# Patient Record
Sex: Female | Born: 1995 | Race: Black or African American | Hispanic: No | Marital: Single | Smoking: Never smoker
Health system: Southern US, Community
[De-identification: ages and names within clinical notes are randomized; demographics above are authoritative.]

## PROBLEM LIST (undated history)

## (undated) DIAGNOSIS — F419 Anxiety disorder, unspecified: Secondary | ICD-10-CM

## (undated) DIAGNOSIS — F329 Major depressive disorder, single episode, unspecified: Secondary | ICD-10-CM

## (undated) DIAGNOSIS — J302 Other seasonal allergic rhinitis: Secondary | ICD-10-CM

## (undated) DIAGNOSIS — F53 Postpartum depression: Secondary | ICD-10-CM

## (undated) DIAGNOSIS — J45909 Unspecified asthma, uncomplicated: Secondary | ICD-10-CM

## (undated) HISTORY — DX: Postpartum depression: F53.0

## (undated) HISTORY — DX: Major depressive disorder, single episode, unspecified: F32.9

## (undated) HISTORY — DX: Anxiety disorder, unspecified: F41.9

## (undated) HISTORY — PX: WISDOM TOOTH EXTRACTION: SHX21

## (undated) HISTORY — PX: ANKLE SURGERY: SHX546

---
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
## 2015-07-28 ENCOUNTER — Encounter (HOSPITAL_COMMUNITY): Payer: Self-pay | Admitting: Emergency Medicine

## 2015-07-28 ENCOUNTER — Emergency Department (HOSPITAL_COMMUNITY): Payer: Medicaid Other

## 2015-07-28 ENCOUNTER — Emergency Department (HOSPITAL_COMMUNITY)
Admission: EM | Admit: 2015-07-28 | Discharge: 2015-07-28 | Disposition: A | Discharge status: Home / Self Care | Payer: Medicaid Other | Attending: Emergency Medicine | Admitting: Emergency Medicine

## 2015-07-28 DIAGNOSIS — J45909 Unspecified asthma, uncomplicated: Secondary | ICD-10-CM | POA: Insufficient documentation

## 2015-07-28 DIAGNOSIS — M25571 Pain in right ankle and joints of right foot: Secondary | ICD-10-CM | POA: Insufficient documentation

## 2015-07-28 MED ORDER — NAPROXEN 500 MG PO TABS: 500.0000 mg | ORAL_TABLET | Freq: Two times a day (BID) | ORAL | Status: DC

## 2015-07-28 MED ORDER — DIAZEPAM 5 MG PO TABS: 5.0000 mg | ORAL_TABLET | Freq: Two times a day (BID) | ORAL | Status: DC

## 2015-07-28 MED ORDER — DIAZEPAM 5 MG PO TABS
5.0000 mg | ORAL_TABLET | Freq: Once | ORAL | Status: AC
  Administered 2015-07-28: 5 mg via ORAL
  Filled 2015-07-28: qty 1

## 2015-07-28 MED ORDER — NAPROXEN 250 MG PO TABS
500.0000 mg | ORAL_TABLET | Freq: Once | ORAL | Status: AC
  Administered 2015-07-28: 500 mg via ORAL
  Filled 2015-07-28: qty 2

## 2015-07-28 NOTE — ED Notes (Signed)
Pt sts increased right ankle pain after starting job that involves increased walking; pt sts hx of sx on same ankle

## 2015-07-28 NOTE — ED Provider Notes (Signed)
History  By signing my name below, I, Earmon PhoenixJennifer Waddell, attest that this documentation has been prepared under the direction and in the presence of S. Lane HackerNicole Jenin Birdsall, PA-C. Electronically Signed: Earmon PhoenixJennifer Waddell, ED Scribe. 07/28/2015. 12:12 PM.  Chief Complaint  Patient presents with  . Ankle Pain   The history is provided by the patient and medical records. No language interpreter was used.    HPI Comments:  Alison Mcbride is a 20 y.o. female who presents to the Emergency Department complaining of right ankle pain that began worsening about two weeks ago. She states she normally has pain since started her job but felt a pulling sensation while at work a couple weeks ago. She has been soaking the right foot in hot water and epsom salt and has taken Tylenol with no significant relief of the pain. Bearing weight and walking increases the pain. She denies alleviating factors. She denies fever, chills, nausea, vomiting, bruising, wounds, tingling or weakness of the right foot or ankle. She reports having surgery to the right ankle in the past.    Past Medical History  Diagnosis Date  . Asthma   . Seasonal allergies    Past Surgical History  Procedure Laterality Date  . Ankle surgery    . Wisdom tooth extraction     History reviewed. No pertinent family history. Social History  Substance Use Topics  . Smoking status: Never Smoker   . Smokeless tobacco: Never Used  . Alcohol Use: No   OB History    Gravida Para Term Preterm AB TAB SAB Ectopic Multiple Living   1 1 1       0 1     Review of Systems A complete 10 system review of systems was obtained and all systems are negative except as noted in the HPI and PMH.   Allergies  Review of patient's allergies indicates no known allergies.  Home Medications   Prior to Admission medications   Medication Sig Start Date End Date Taking? Authorizing Provider  diazepam (VALIUM) 5 MG tablet Take 1 tablet (5 mg total) by mouth 2 (two)  times daily. 07/28/15   Melton KrebsSamantha Nicole Alfredia Desanctis, PA-C  ibuprofen (ADVIL,MOTRIN) 600 MG tablet Take 1 tablet (600 mg total) by mouth every 6 (six) hours. Patient not taking: Reported on 11/04/2014 10/02/14   Arvilla Marketatherine Lauren Wallace, DO  naproxen (NAPROSYN) 500 MG tablet Take 1 tablet (500 mg total) by mouth 2 (two) times daily. 07/28/15   Melton KrebsSamantha Nicole Jonella Redditt, PA-C   Triage Vitals: BP 142/90 mmHg  Pulse 96  Temp(Src) 98.6 F (37 C) (Oral)  Resp 18  SpO2 100% Physical Exam  Constitutional: She is oriented to person, place, and time. She appears well-developed and well-nourished.  HENT:  Head: Normocephalic and atraumatic.  Eyes: EOM are normal.  Neck: Normal range of motion.  Cardiovascular: Normal rate.   Pulmonary/Chest: Effort normal.  Musculoskeletal: Normal range of motion. She exhibits tenderness.  Tenderness diffuse right ankle. Well-healed scars noted to right ankle. Full ROM of right ankle.  Neurological: She is alert and oriented to person, place, and time.  NVI.  Skin: Skin is warm and dry.  Psychiatric: She has a normal mood and affect. Her behavior is normal.  Nursing note and vitals reviewed.   ED Course  Procedures  DIAGNOSTIC STUDIES: Oxygen Saturation is 100% on RA, normal by my interpretation.   COORDINATION OF CARE: 12:00 PM- Informed pt of negative X-Ray. Advised pt to follow up with previous orthopedist. Return precautions discussed.  Will provide crutches. Pt verbalizes understanding and agrees to plan.  Medications  diazepam (VALIUM) tablet 5 mg (not administered)  naproxen (NAPROSYN) tablet 500 mg (not administered)   Imaging Review Dg Ankle Complete Right  07/28/2015  CLINICAL DATA:  Pain and swelling after prolonged standing. No recent trauma. EXAM: RIGHT ANKLE - COMPLETE 3+ VIEW COMPARISON:  None. FINDINGS: Frontal, oblique, and lateral views were obtained. There is postoperative change in the medial malleolar region as well as in the distal fibula. There  is chronic medial displacement of the medial malleolus with respect to the remainder the tibia. Alignment in the distal fibula is anatomic. There is no acute fracture or joint effusion. Ankle mortise appears intact. There is narrowing in the medial aspect of the ankle joint. There is a mild spurring anteriorly off the distal tibia. IMPRESSION: Areas of old trauma with screw and plate fixation. Chronic remodeling along the medial malleolus with the medial malleolus displaced slightly medial to its expected position. Narrowing of the medial aspect of the ankle joint is noted. No acute fracture. Mortise appears grossly intact. Electronically Signed   By: Bretta Bang III M.D.   On: 07/28/2015 10:40   I have personally reviewed and evaluated these images and lab results as part of my medical decision-making.   MDM   Final diagnoses:  Right ankle pain   Patient X-Ray negative for obvious fracture or dislocation. Pain managed in ED. Pt advised to follow up with orthopedics if symptoms persist for possibility of missed fracture diagnosis. Patient given ace wrap and crutches while in ED, conservative therapy recommended and discussed. Patient will be dc home & is agreeable with above plan.   I personally performed the services described in this documentation, which was scribed in my presence. The recorded information has been reviewed and is accurate.   Melton Krebs, PA-C 07/31/15 1124  Margarita Grizzle, MD 08/02/15 1434

## 2015-07-28 NOTE — Discharge Instructions (Signed)
[name],  Nice meeting you! Please follow-up with your orthopedic surgeon or primary care provider. Return to the emergency department if you develop fevers, chills, increased pain/swelling, or notice discolorations/color changes in your foot/leg. Feel better soon!  S. Lane HackerNicole Marisol Glazer, PA-C Ankle Pain Ankle pain is a common symptom. The bones, cartilage, tendons, and muscles of the ankle joint perform a lot of work each day. The ankle joint holds your body weight and allows you to move around. Ankle pain can occur on either side or back of 1 or both ankles. Ankle pain may be sharp and burning or dull and aching. There may be tenderness, stiffness, redness, or warmth around the ankle. The pain occurs more often when a person walks or puts pressure on the ankle. CAUSES  There are many reasons ankle pain can develop. It is important to work with your caregiver to identify the cause since many conditions can impact the bones, cartilage, muscles, and tendons. Causes for ankle pain include:  Injury, including a break (fracture), sprain, or strain often due to a fall, sports, or a high-impact activity.  Swelling (inflammation) of a tendon (tendonitis).  Achilles tendon rupture.  Ankle instability after repeated sprains and strains.  Poor foot alignment.  Pressure on a nerve (tarsal tunnel syndrome).  Arthritis in the ankle or the lining of the ankle.  Crystal formation in the ankle (gout or pseudogout). DIAGNOSIS  A diagnosis is based on your medical history, your symptoms, results of your physical exam, and results of diagnostic tests. Diagnostic tests may include X-ray exams or a computerized magnetic scan (magnetic resonance imaging, MRI). TREATMENT  Treatment will depend on the cause of your ankle pain and may include:  Keeping pressure off the ankle and limiting activities.  Using crutches or other walking support (a cane or brace).  Using rest, ice, compression, and  elevation.  Participating in physical therapy or home exercises.  Wearing shoe inserts or special shoes.  Losing weight.  Taking medications to reduce pain or swelling or receiving an injection.  Undergoing surgery. HOME CARE INSTRUCTIONS   Only take over-the-counter or prescription medicines for pain, discomfort, or fever as directed by your caregiver.  Put ice on the injured area.  Put ice in a plastic bag.  Place a towel between your skin and the bag.  Leave the ice on for 15-20 minutes at a time, 03-04 times a day.  Keep your leg raised (elevated) when possible to lessen swelling.  Avoid activities that cause ankle pain.  Follow specific exercises as directed by your caregiver.  Record how often you have ankle pain, the location of the pain, and what it feels like. This information may be helpful to you and your caregiver.  Ask your caregiver about returning to work or sports and whether you should drive.  Follow up with your caregiver for further examination, therapy, or testing as directed. SEEK MEDICAL CARE IF:   Pain or swelling continues or worsens beyond 1 week.  You have an oral temperature above 102 F (38.9 C).  You are feeling unwell or have chills.  You are having an increasingly difficult time with walking.  You have loss of sensation or other new symptoms.  You have questions or concerns. MAKE SURE YOU:   Understand these instructions.  Will watch your condition.  Will get help right away if you are not doing well or get worse.   This information is not intended to replace advice given to you by  your health care provider. Make sure you discuss any questions you have with your health care provider.   Document Released: 08/19/2009 Document Revised: 05/24/2011 Document Reviewed: 10/01/2014 Elsevier Interactive Patient Education [employer]

## 2015-08-15 ENCOUNTER — Emergency Department (HOSPITAL_COMMUNITY): Payer: Medicaid Other

## 2015-08-15 ENCOUNTER — Encounter (HOSPITAL_COMMUNITY): Payer: Self-pay | Admitting: Emergency Medicine

## 2015-08-15 DIAGNOSIS — J45901 Unspecified asthma with (acute) exacerbation: Secondary | ICD-10-CM | POA: Insufficient documentation

## 2015-08-15 DIAGNOSIS — Z79899 Other long term (current) drug therapy: Secondary | ICD-10-CM | POA: Diagnosis not present

## 2015-08-15 DIAGNOSIS — Z791 Long term (current) use of non-steroidal anti-inflammatories (NSAID): Secondary | ICD-10-CM | POA: Insufficient documentation

## 2015-08-15 DIAGNOSIS — R0602 Shortness of breath: Secondary | ICD-10-CM | POA: Diagnosis present

## 2015-08-15 MED ORDER — ALBUTEROL SULFATE (2.5 MG/3ML) 0.083% IN NEBU
5.0000 mg | INHALATION_SOLUTION | Freq: Once | RESPIRATORY_TRACT | Status: AC
  Administered 2015-08-15: 5 mg via RESPIRATORY_TRACT

## 2015-08-15 MED ORDER — ALBUTEROL SULFATE (2.5 MG/3ML) 0.083% IN NEBU
INHALATION_SOLUTION | RESPIRATORY_TRACT | Status: AC
  Filled 2015-08-15: qty 6

## 2015-08-15 NOTE — ED Notes (Signed)
Pt here with SOB and productive cough since yesterday. Pt reports recent hx of allergies. Pt also sts she is out of her albuterol neb treatments at home.

## 2015-08-16 ENCOUNTER — Emergency Department (HOSPITAL_COMMUNITY)
Admission: EM | Admit: 2015-08-16 | Discharge: 2015-08-16 | Disposition: A | Discharge status: Home / Self Care | Payer: Medicaid Other | Attending: Emergency Medicine | Admitting: Emergency Medicine

## 2015-08-16 DIAGNOSIS — J45901 Unspecified asthma with (acute) exacerbation: Secondary | ICD-10-CM

## 2015-08-16 MED ORDER — ALBUTEROL SULFATE HFA 108 (90 BASE) MCG/ACT IN AERS: 1.0000 | INHALATION_SPRAY | Freq: Four times a day (QID) | RESPIRATORY_TRACT | Status: AC | PRN

## 2015-08-16 MED ORDER — ALBUTEROL SULFATE HFA 108 (90 BASE) MCG/ACT IN AERS
2.0000 | INHALATION_SPRAY | RESPIRATORY_TRACT | Status: DC | PRN
  Administered 2015-08-16: 2 via RESPIRATORY_TRACT
  Filled 2015-08-16: qty 6.7

## 2015-08-16 NOTE — ED Provider Notes (Signed)
CSN: [id number]     Arrival date & time 08/15/15  2254 History   First MD Initiated Contact with Patient 08/16/15 0211     Chief Complaint  Patient presents with  . Shortness of Breath  . Asthma     (Consider location/radiation/quality/duration/timing/severity/associated sxs/prior Treatment) HPI Comments: Is a 20 year old female who reports that she has seasonally induced asthma.  She noticed yesterday she was having some difficulty breathing and wheezing.  She does not have an inhaler at this time.  She presents emergency department with worsening symptoms.  Patient is a 20 y.o. female presenting with shortness of breath and asthma. The history is provided by the patient.  Shortness of Breath Severity:  Mild Onset quality:  Gradual Duration:  2 days Timing:  Intermittent Progression:  Worsening Chronicity:  Recurrent Relieved by:  None tried Worsened by:  Activity Ineffective treatments:  None tried Associated symptoms: cough and wheezing   Associated symptoms: no abdominal pain, no fever, no rash and no vomiting   Asthma Associated symptoms include coughing. Pertinent negatives include no abdominal pain, chills, congestion, fever, rash or vomiting.    Past Medical History  Diagnosis Date  . Asthma   . Seasonal allergies    Past Surgical History  Procedure Laterality Date  . Ankle surgery    . Wisdom tooth extraction     History reviewed. No pertinent family history. Social History  Substance Use Topics  . Smoking status: Never Smoker   . Smokeless tobacco: Never Used  . Alcohol Use: Yes     Comment: occasional   OB History    Gravida Para Term Preterm AB TAB SAB Ectopic Multiple Living   1 1 1       0 1     Review of Systems  Constitutional: Negative for fever and chills.  HENT: Negative for congestion and rhinorrhea.   Respiratory: Positive for cough, shortness of breath and wheezing.   Gastrointestinal: Negative for vomiting and abdominal pain.  Skin:  Negative for rash.  All other systems reviewed and are negative.     Allergies  Review of patient's allergies indicates no known allergies.  Home Medications   Prior to Admission medications   Medication Sig Start Date End Date Taking? Authorizing Provider  albuterol (PROVENTIL HFA;VENTOLIN HFA) 108 (90 Base) MCG/ACT inhaler Inhale 1-2 puffs into the lungs every 6 (six) hours as needed for wheezing or shortness of breath. 08/16/15   Earley FavorGail Edana Aguado, NP  diazepam (VALIUM) 5 MG tablet Take 1 tablet (5 mg total) by mouth 2 (two) times daily. 07/28/15   Melton KrebsSamantha Nicole Riley, PA-C  ibuprofen (ADVIL,MOTRIN) 600 MG tablet Take 1 tablet (600 mg total) by mouth every 6 (six) hours. Patient not taking: Reported on 11/04/2014 10/02/14   Arvilla Marketatherine Lauren Wallace, DO  naproxen (NAPROSYN) 500 MG tablet Take 1 tablet (500 mg total) by mouth 2 (two) times daily. 07/28/15   Melton KrebsSamantha Nicole Riley, PA-C   BP 158/93 mmHg  Pulse 100  Temp(Src) 98.3 F (36.8 C) (Oral)  Resp 20  SpO2 100%  LMP 07/15/2015 Physical Exam  Constitutional: She appears well-developed and well-nourished.  HENT:  Head: Normocephalic.  Eyes: Pupils are equal, round, and reactive to light.  Neck: Normal range of motion.  Cardiovascular: Normal rate and regular rhythm.   Pulmonary/Chest: Effort normal and breath sounds normal. No respiratory distress. She has no wheezes.  Examine the patient after she received an albuterol treatment in triage.  She's no longer wheezing.  She does have  an occasional nonproductive cough  Nursing note and vitals reviewed.   ED Course  Procedures (including critical care time) Labs Review Labs Reviewed - No data to display  Imaging Review Dg Chest 2 View  08/15/2015  CLINICAL DATA:  Acute onset of shortness of breath, cough, congestion and generalized chest pain. Initial encounter. EXAM: CHEST  2 VIEW COMPARISON:  Chest radiograph performed 07/06/2010 FINDINGS: The lungs are well-aerated. Mild  peribronchial thickening is noted. There is no evidence of focal opacification, pleural effusion or pneumothorax. The heart is normal in size; the mediastinal contour is within normal limits. No acute osseous abnormalities are seen. IMPRESSION: Mild peribronchial thickening noted.  Lungs otherwise clear. Electronically Signed   By: Roanna Raider M.D.   On: 08/15/2015 23:37   I have personally reviewed and evaluated these images and lab results as part of my medical decision-making.   EKG Interpretation None      MDM   Final diagnoses:  Asthma attack         Earley Favor, NP 08/16/15 1610  Zadie Rhine, MD 08/16/15 [phone]

## 2015-08-16 NOTE — Discharge Instructions (Signed)
Been given an inhaler in the emergency department to use as follows 2 puffs every 4-6 hours as needed while awake for the next 1-2 days then as needed.  You've also been given a prescription for an additional inhaler as well as a referral to community wellness to that, you can establish local medical care   Asthma, Adult Asthma is a condition of the lungs in which the airways tighten and narrow. Asthma can make it hard to breathe. Asthma cannot be cured, but medicine and lifestyle changes can help control it. Asthma may be started (triggered) by:  Animal skin flakes (dander).  Dust.  Cockroaches.  Pollen.  Mold.  Smoke.  Cleaning products.  Hair sprays or aerosol sprays.  Paint fumes or strong smells.  Cold air, weather changes, and winds.  Crying or laughing hard.  Stress.  Certain medicines or drugs.  Foods, such as dried fruit, potato chips, and sparkling grape juice.  Infections or conditions (colds, flu).  Exercise.  Certain medical conditions or diseases.  Exercise or tiring activities. HOME CARE   Take medicine as told by your doctor.  Use a peak flow meter as told by your doctor. A peak flow meter is a tool that measures how well the lungs are working.  Record and keep track of the peak flow meter's readings.  Understand and use the asthma action plan. An asthma action plan is a written plan for taking care of your asthma and treating your attacks.  To help prevent asthma attacks:  Do not smoke. Stay away from secondhand smoke.  Change your heating and air conditioning filter often.  Limit your use of fireplaces and wood stoves.  Get rid of pests (such as roaches and mice) and their droppings.  Throw away plants if you see mold on them.  Clean your floors. Dust regularly. Use cleaning products that do not smell.  Have someone vacuum when you are not home. Use a vacuum cleaner with a HEPA filter if possible.  Replace carpet with wood, tile, or  vinyl flooring. Carpet can trap animal skin flakes and dust.  Use allergy-proof pillows, mattress covers, and box spring covers.  Wash bed sheets and blankets every week in hot water and dry them in a dryer.  Use blankets that are made of polyester or cotton.  Clean bathrooms and kitchens with bleach. If possible, have someone repaint the walls in these rooms with mold-resistant paint. Keep out of the rooms that are being cleaned and painted.  Wash hands often. GET HELP IF:  You have make a whistling sound when breaking (wheeze), have shortness of breath, or have a cough even if taking medicine to prevent attacks.  The colored mucus you cough up (sputum) is thicker than usual.  The colored mucus you cough up changes from clear or white to yellow, green, gray, or bloody.  You have problems from the medicine you are taking such as:  A rash.  Itching.  Swelling.  Trouble breathing.  You need reliever medicines more than 2-3 times a week.  Your peak flow measurement is still at 50-79% of your personal best after following the action plan for 1 hour.  You have a fever. GET HELP RIGHT AWAY IF:   You seem to be worse and are not responding to medicine during an asthma attack.  You are short of breath even at rest.  You get short of breath when doing very little activity.  You have trouble eating, drinking, or talking.  You have chest pain.  You have a fast heartbeat.  Your lips or fingernails start to turn blue.  You are light-headed, dizzy, or faint.  Your peak flow is less than 50% of your personal best.   This information is not intended to replace advice given to you by your health care provider. Make sure you discuss any questions you have with your health care provider.   Document Released: 08/18/2007 Document Revised: 11/20/2014 Document Reviewed: 09/28/2012 Elsevier Interactive Patient Education [employer]

## 2015-10-29 ENCOUNTER — Ambulatory Visit: Payer: Medicaid Other | Admitting: Advanced Practice Midwife

## 2015-11-10 ENCOUNTER — Ambulatory Visit (INDEPENDENT_AMBULATORY_CARE_PROVIDER_SITE_OTHER): Payer: Medicaid Other | Admitting: Family Medicine

## 2015-11-10 DIAGNOSIS — Z3046 Encounter for surveillance of implantable subdermal contraceptive: Secondary | ICD-10-CM | POA: Diagnosis not present

## 2015-11-10 NOTE — Progress Notes (Signed)
     GYNECOLOGY CLINIC PROCEDURE NOTE  Alison Mcbride is a 20 y.o. G1P1001 here for Nexplanon removal 2/2 side effects of headaches and weight gain. Last pap smear was on N/A (under 20 y/o).  No other gynecologic concerns.   Nexplanon Removal Patient identified, informed consent performed, consent signed.   Appropriate time out taken. Nexplanon site identified.  Area prepped in usual sterile fashon. One ml of 1% lidocaine was used to anesthetize the area at the distal end of the implant. A small stab incision was made right beside the implant on the distal portion.  The Nexplanon rod was grasped using hemostats and removed without difficulty.  There was minimal blood loss. There were no complications.  3 ml of 1% lidocaine was injected around the incision for post-procedure analgesia.  Steri-strips were applied over the small incision.  A pressure bandage was applied to reduce any bruising.  The patient tolerated the procedure well and was given post procedure instructions.  Patient is planning to use abstinence for contraception or will return for contraception management at a later date when ready.    Cleda ClarksELIZABETH W. Makaio Mach, DO [facility], [facility]

## 2015-11-10 NOTE — Patient Instructions (Signed)
Incision Care °An incision is when a surgeon cuts into your body. After surgery, the incision needs to be cared for properly to prevent infection.  °HOW TO CARE FOR YOUR INCISION °· Take medicines only as directed by your health care provider. °· There are many different ways to close and cover an incision, including stitches, skin glue, and adhesive strips. Follow your health care provider's instructions on: °¨ Incision care. °¨ Bandage (dressing) changes and removal. °¨ Incision closure removal. °· Do not take baths, swim, or use a hot tub until your health care provider approves. You may shower as directed by your health care provider. °· Resume your normal diet and activities as directed. °· Use anti-itch medicine (such as an antihistamine) as directed by your health care provider. The incision may itch while it is healing. Do not pick or scratch at the incision. °· Drink enough fluid to keep your urine clear or pale yellow. °SEEK MEDICAL CARE IF:  °· You have drainage, redness, swelling, or pain at your incision site. °· You have muscle aches, chills, or a general ill feeling. °· You notice a bad smell coming from the incision or dressing. °· Your incision edges separate after the sutures, staples, or skin adhesive strips have been removed. °· You have persistent nausea or vomiting. °· You have a fever. °· You are dizzy. °SEEK IMMEDIATE MEDICAL CARE IF:  °· You have a rash. °· You faint. °· You have difficulty breathing. °MAKE SURE YOU:  °· Understand these instructions. °· Will watch your condition. °· Will get help right away if you are not doing well or get worse. °  °This information is not intended to replace advice given to you by your health care provider. Make sure you discuss any questions you have with your health care provider. °  °Document Released: 09/18/2004 Document Revised: 03/22/2014 Document Reviewed: 04/25/2013 °Elsevier Interactive Patient Education ©2016 Elsevier Inc. ° °

## 2015-11-12 ENCOUNTER — Encounter: Payer: Self-pay | Admitting: Family Medicine

## 2017-04-18 ENCOUNTER — Ambulatory Visit: Payer: Self-pay | Admitting: Obstetrics & Gynecology

## 2017-04-19 IMAGING — CR DG ANKLE COMPLETE 3+V*R*
3 series · 3 of 3 positions shown · non-contrast
Comparison: None.

CLINICAL DATA: Pain and swelling after prolonged standing. No
recent trauma.

EXAM:
RIGHT ANKLE - COMPLETE 3+ VIEW

[ankle ap]
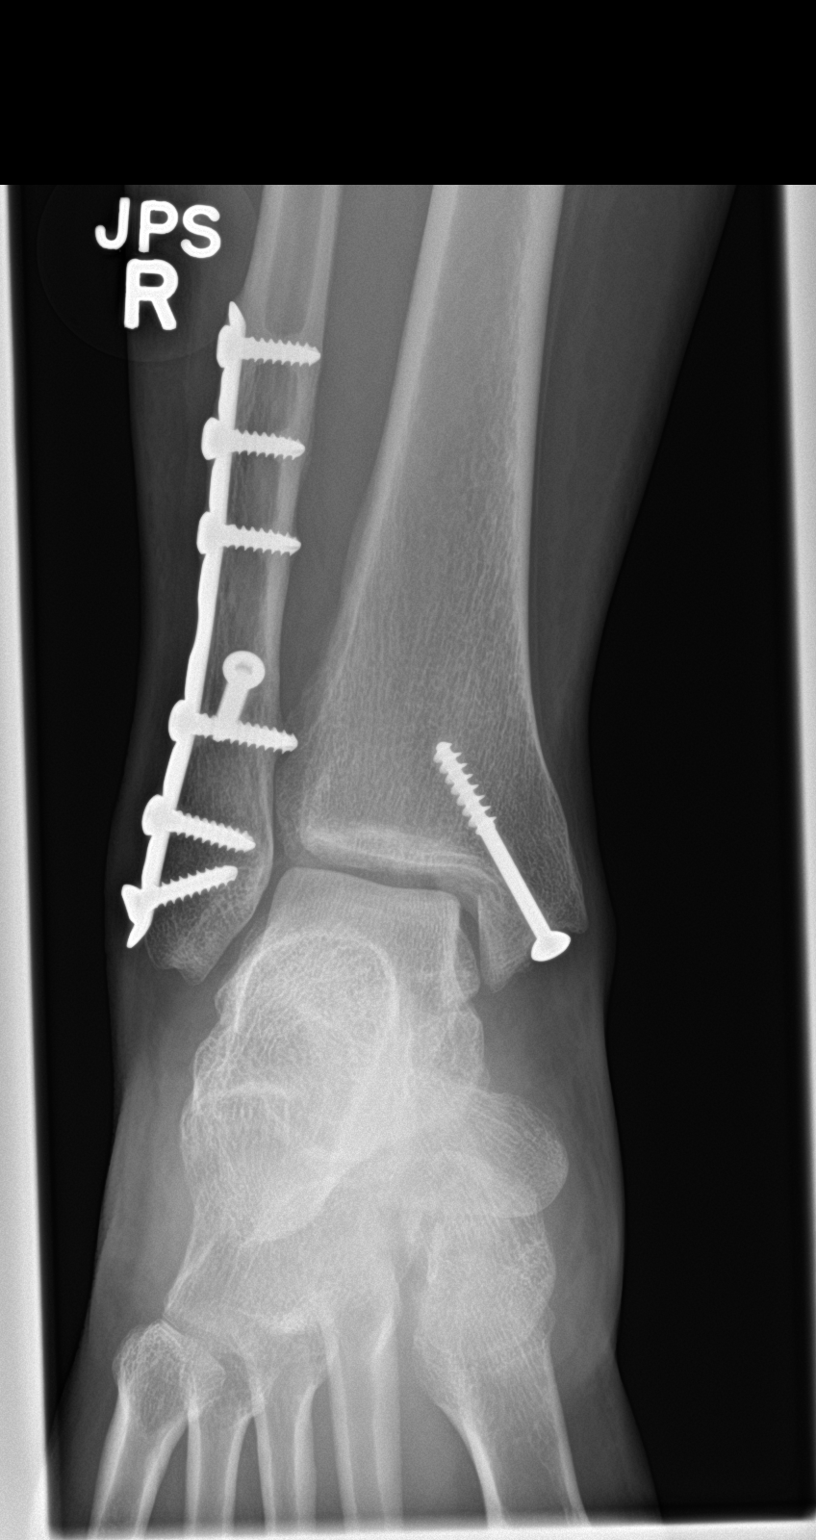

[ankle obl]
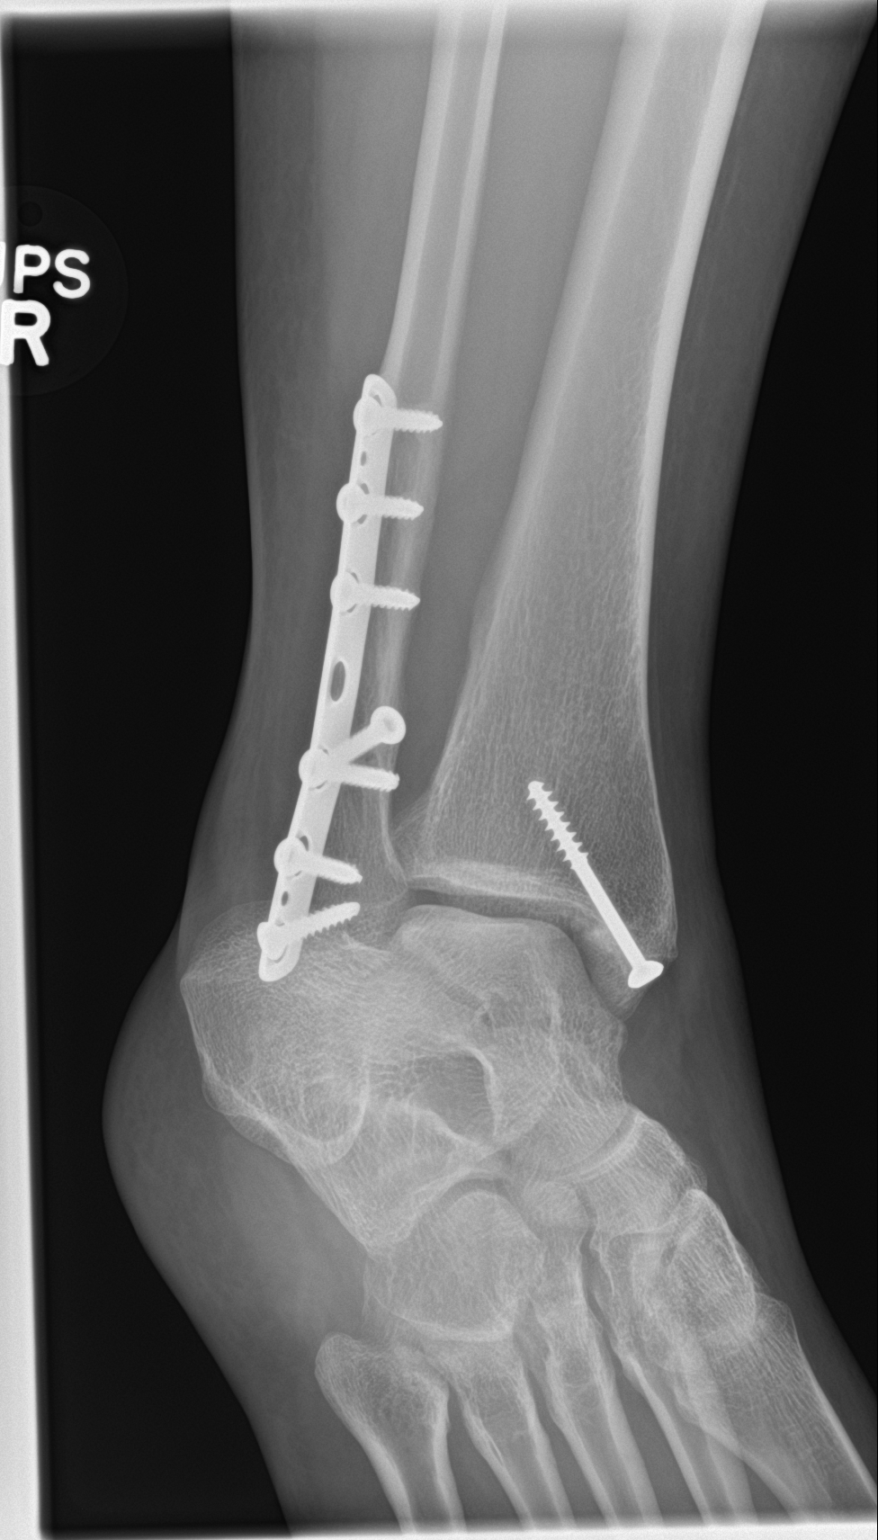

[ankle lat]
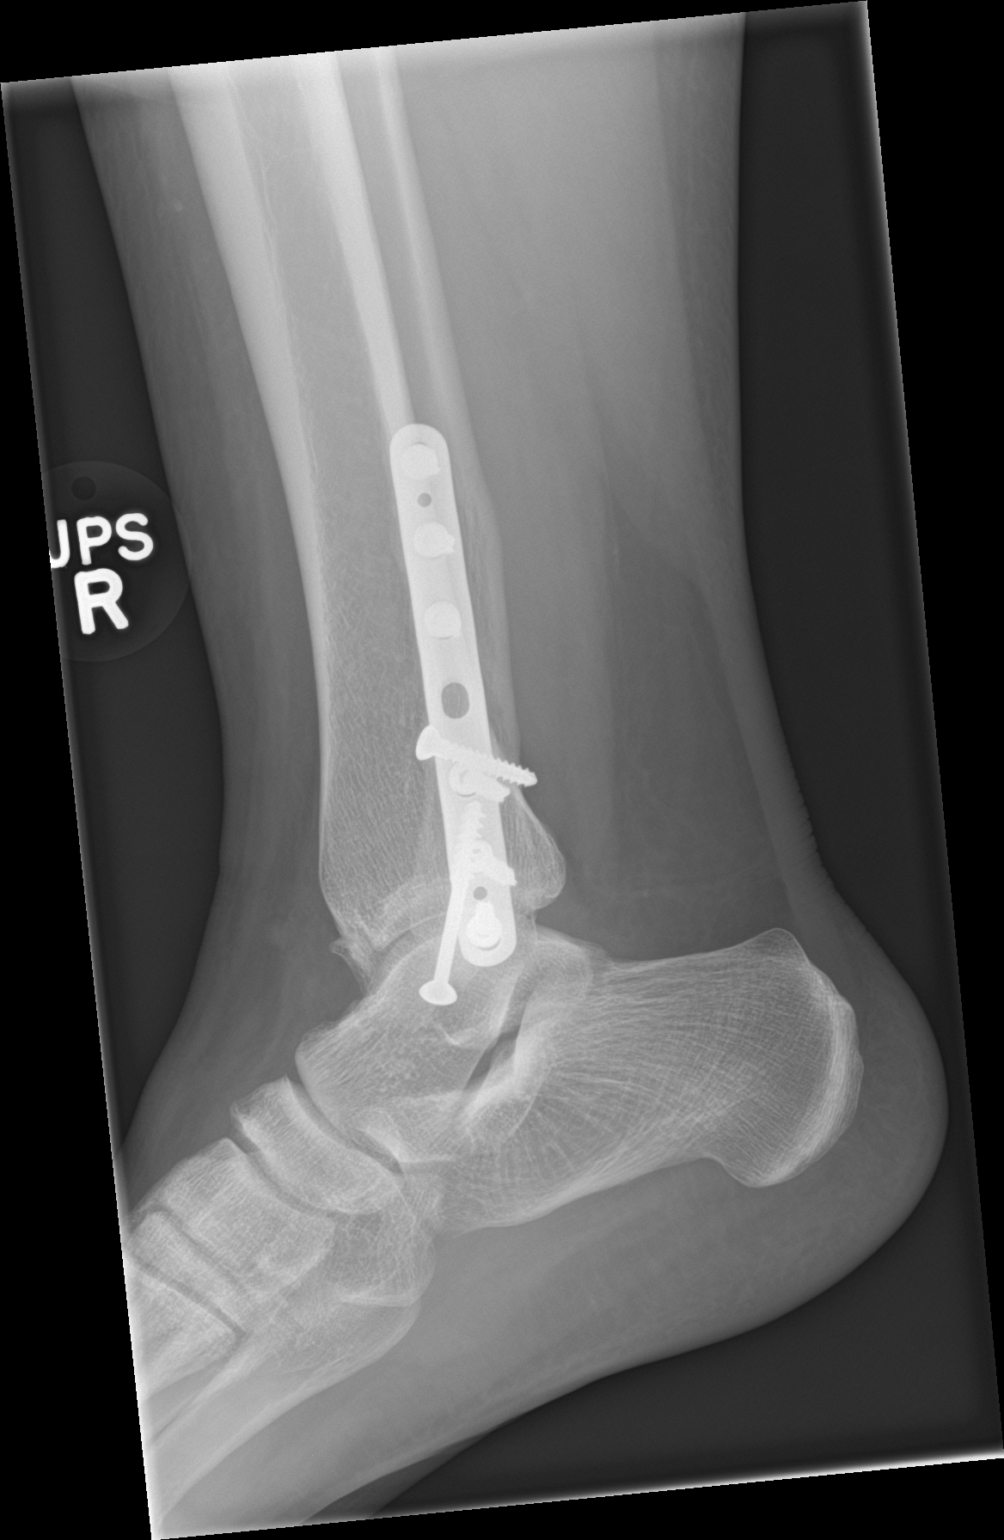

[3 of 3 positions shown; findings below may reference images not displayed]

FINDINGS: Frontal, oblique, and lateral views were obtained. There is
postoperative change in the medial malleolar region as well as in
the distal fibula. There is chronic medial displacement of the
medial malleolus with respect to the remainder the tibia. Alignment
in the distal fibula is anatomic. There is no acute fracture or
joint effusion. Ankle mortise appears intact. There is narrowing in
the medial aspect of the ankle joint. There is a mild spurring
anteriorly off the distal tibia.
IMPRESSION: Areas of old trauma with screw and plate fixation. Chronic
remodeling along the medial malleolus with the medial malleolus
displaced slightly medial to its expected position. Narrowing of the
medial aspect of the ankle joint is noted. No acute fracture.
Mortise appears grossly intact.

## 2017-05-07 IMAGING — CR DG CHEST 2V
2 series · 2 of 2 positions shown · non-contrast
Comparison: Chest radiograph performed 07/06/2010

CLINICAL DATA: Acute onset of shortness of breath, cough,
congestion and generalized chest pain. Initial encounter.

EXAM:
CHEST  2 VIEW

[chest pa]
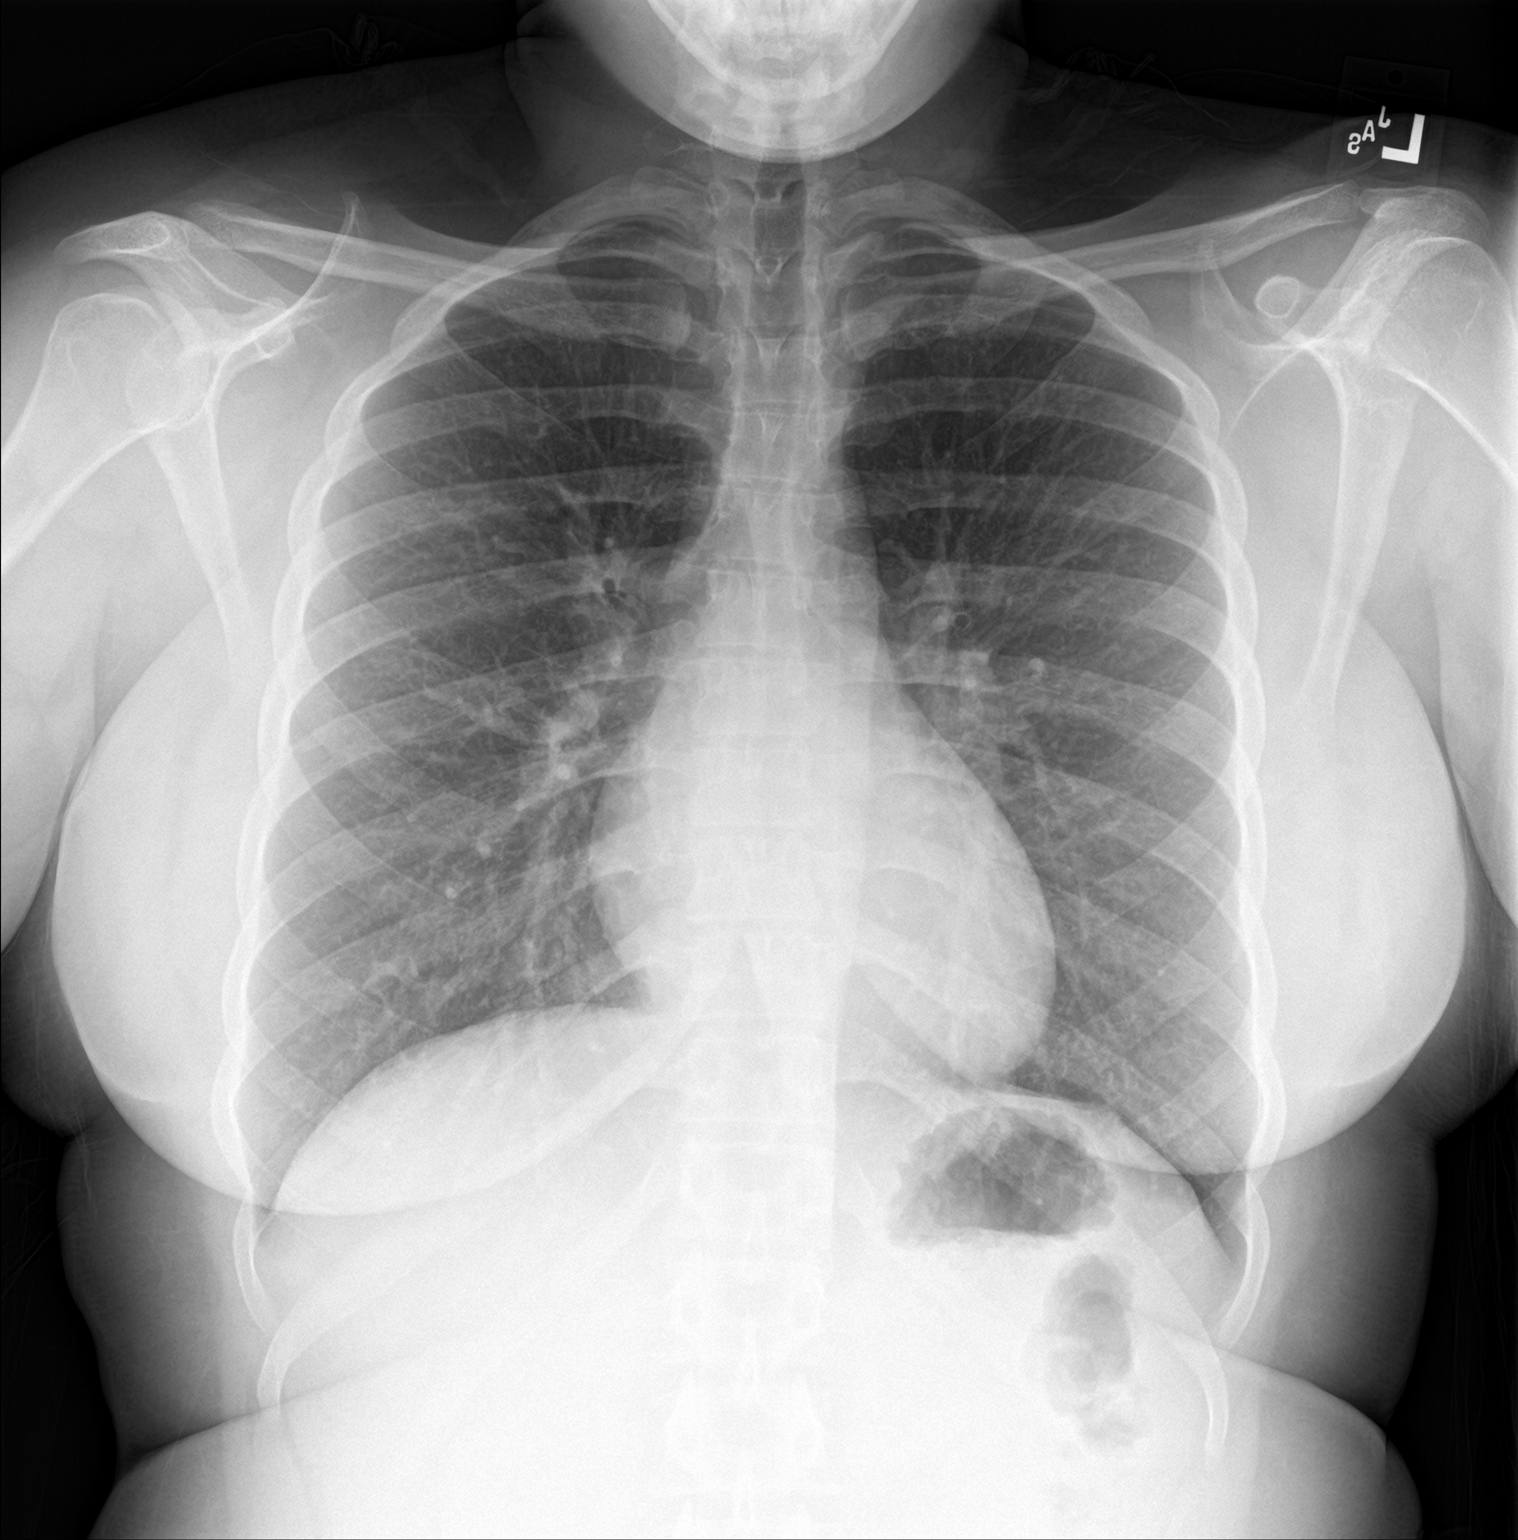

[chest lat]
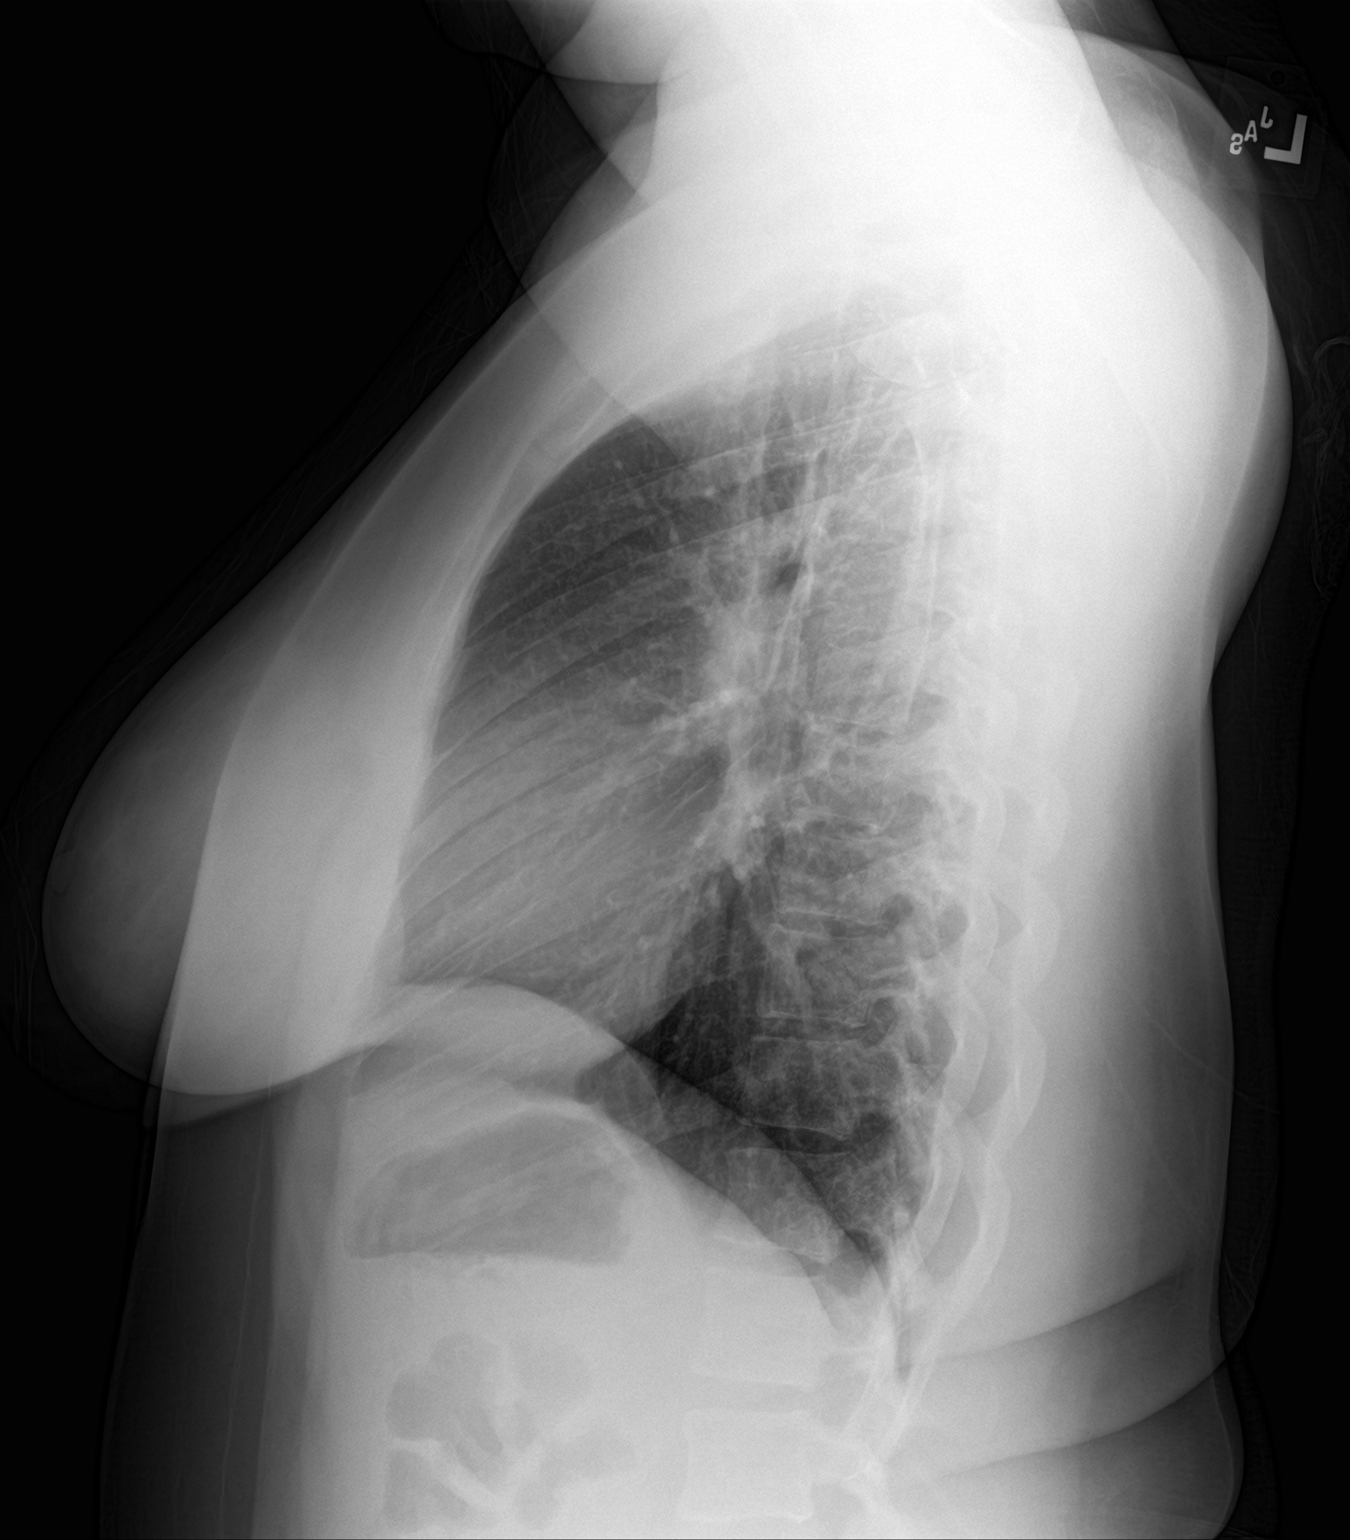

[2 of 2 positions shown; findings below may reference images not displayed]

FINDINGS: The lungs are well-aerated. Mild peribronchial thickening is noted.
There is no evidence of focal opacification, pleural effusion or
pneumothorax.

The heart is normal in size; the mediastinal contour is within
normal limits. No acute osseous abnormalities are seen.
IMPRESSION: Mild peribronchial thickening noted.  Lungs otherwise clear.

## 2018-05-30 ENCOUNTER — Other Ambulatory Visit: Payer: Self-pay

## 2018-05-30 ENCOUNTER — Ambulatory Visit (INDEPENDENT_AMBULATORY_CARE_PROVIDER_SITE_OTHER): Payer: Medicaid Other | Admitting: *Deleted

## 2018-05-30 VITALS — Temp 98.6°F

## 2018-05-30 DIAGNOSIS — Z3200 Encounter for pregnancy test, result unknown: Secondary | ICD-10-CM

## 2018-05-30 DIAGNOSIS — Z3202 Encounter for pregnancy test, result negative: Secondary | ICD-10-CM

## 2018-05-30 LAB — POCT PREGNANCY, URINE: Preg Test, Ur: NEGATIVE

## 2018-05-30 NOTE — Progress Notes (Signed)
Here for pregnancy test which was negative. States cycle is usually regular for 2 years but traveled to [place] recently and now is 3 days late.  I advised her many things can throw your cycle off and I recommend doing another pregnancy test in 1 weeks if she has not had a period.  She voices understanding.  Linda,RN

## 2019-05-14 ENCOUNTER — Other Ambulatory Visit: Payer: Medicaid Other

## 2019-05-16 ENCOUNTER — Ambulatory Visit: Payer: Medicaid Other | Attending: Internal Medicine

## 2019-05-16 DIAGNOSIS — Z20822 Contact with and (suspected) exposure to covid-19: Secondary | ICD-10-CM

## 2019-05-17 ENCOUNTER — Other Ambulatory Visit: Payer: Medicaid Other

## 2019-05-17 LAB — NOVEL CORONAVIRUS, NAA: SARS-CoV-2, NAA: NOT DETECTED

## 2019-06-12 ENCOUNTER — Ambulatory Visit (INDEPENDENT_AMBULATORY_CARE_PROVIDER_SITE_OTHER): Payer: Medicaid Other | Admitting: Clinical

## 2019-06-12 ENCOUNTER — Encounter: Payer: Self-pay | Admitting: Women's Health

## 2019-06-12 ENCOUNTER — Other Ambulatory Visit (HOSPITAL_COMMUNITY)
Admission: RE | Admit: 2019-06-12 | Discharge: 2019-06-12 | Disposition: A | Discharge status: Home / Self Care | Payer: Medicaid Other | Source: Ambulatory Visit | Attending: Women's Health | Admitting: Women's Health

## 2019-06-12 ENCOUNTER — Other Ambulatory Visit: Payer: Self-pay

## 2019-06-12 ENCOUNTER — Encounter: Payer: Self-pay | Admitting: Medical

## 2019-06-12 ENCOUNTER — Ambulatory Visit (INDEPENDENT_AMBULATORY_CARE_PROVIDER_SITE_OTHER): Payer: Medicaid Other | Admitting: Women's Health

## 2019-06-12 VITALS — BP 131/73 | HR 71 | Wt 175.2 lb

## 2019-06-12 DIAGNOSIS — Z124 Encounter for screening for malignant neoplasm of cervix: Secondary | ICD-10-CM | POA: Diagnosis not present

## 2019-06-12 DIAGNOSIS — Z3169 Encounter for other general counseling and advice on procreation: Secondary | ICD-10-CM

## 2019-06-12 DIAGNOSIS — Z658 Other specified problems related to psychosocial circumstances: Secondary | ICD-10-CM | POA: Diagnosis not present

## 2019-06-12 DIAGNOSIS — F4322 Adjustment disorder with anxiety: Secondary | ICD-10-CM

## 2019-06-12 DIAGNOSIS — F419 Anxiety disorder, unspecified: Secondary | ICD-10-CM | POA: Diagnosis not present

## 2019-06-12 NOTE — Patient Instructions (Addendum)
Managing Anxiety, Adult After being diagnosed with an anxiety disorder, you may be relieved to know why you have felt or behaved a certain way. You may also feel overwhelmed about the treatment ahead and what it will mean for your life. With care and support, you can manage this condition and recover from it. How to manage lifestyle changes Managing stress and anxiety  Stress is your body's reaction to life changes and events, both good and bad. Most stress will last just a few hours, but stress can be ongoing and can lead to more than just stress. Although stress can play a major role in anxiety, it is not the same as anxiety. Stress is usually caused by something external, such as a deadline, test, or competition. Stress normally passes after the triggering event has ended.  Anxiety is caused by something internal, such as imagining a terrible outcome or worrying that something will go wrong that will devastate you. Anxiety often does not go away even after the triggering event is over, and it can become long-term (chronic) worry. It is important to understand the differences between stress and anxiety and to manage your stress effectively so that it does not lead to an anxious response. Talk with your health care provider or a counselor to learn more about reducing anxiety and stress. He or she may suggest tension reduction techniques, such as:  Music therapy. This can include creating or listening to music that you enjoy and that inspires you.  Mindfulness-based meditation. This involves being aware of your normal breaths while not trying to control your breathing. It can be done while sitting or walking.  Centering prayer. This involves focusing on a word, phrase, or sacred image that means something to you and brings you peace.  Deep breathing. To do this, expand your stomach and inhale slowly through your nose. Hold your breath for 3-5 seconds. Then exhale slowly, letting your stomach muscles  relax.  Self-talk. This involves identifying thought patterns that lead to anxiety reactions and changing those patterns.  Muscle relaxation. This involves tensing muscles and then relaxing them. Choose a tension reduction technique that suits your lifestyle and personality. These techniques take time and practice. Set aside 5-15 minutes a day to do them. Therapists can offer counseling and training in these techniques. The training to help with anxiety may be covered by some insurance plans. Other things you can do to manage stress and anxiety include:  Keeping a stress/anxiety diary. This can help you learn what triggers your reaction and then learn ways to manage your response.  Thinking about how you react to certain situations. You may not be able to control everything, but you can control your response.  Making time for activities that help you relax and not feeling guilty about spending your time in this way.  Visual imagery and yoga can help you stay calm and relax.  Medicines Medicines can help ease symptoms. Medicines for anxiety include:  Anti-anxiety drugs.  Antidepressants. Medicines are often used as a primary treatment for anxiety disorder. Medicines will be prescribed by a health care provider. When used together, medicines, psychotherapy, and tension reduction techniques may be the most effective treatment. Relationships Relationships can play a big part in helping you recover. Try to spend more time connecting with trusted friends and family members. Consider going to couples counseling, taking family education classes, or going to family therapy. Therapy can help you and others better understand your condition. How to recognize changes in your   anxiety Everyone responds differently to treatment for anxiety. Recovery from anxiety happens when symptoms decrease and stop interfering with your daily activities at home or work. This may mean that you will start to:  Have  better concentration and focus. Worry will interfere less in your daily thinking.  Sleep better.  Be less irritable.  Have more energy.  Have improved memory. It is important to recognize when your condition is getting worse. Contact your health care provider if your symptoms interfere with home or work and you feel like your condition is not improving. Follow these instructions at home: Activity  Exercise. Most adults should do the following: ? Exercise for at least 150 minutes each week. The exercise should increase your heart rate and make you sweat (moderate-intensity exercise). ? Strengthening exercises at least twice a week.  Get the right amount and quality of sleep. Most adults need 7-9 hours of sleep each night. Lifestyle   Eat a healthy diet that includes plenty of vegetables, fruits, whole grains, low-fat dairy products, and lean protein. Do not eat a lot of foods that are high in solid fats, added sugars, or salt.  Make choices that simplify your life.  Do not use any products that contain nicotine or tobacco, such as cigarettes, e-cigarettes, and chewing tobacco. If you need help quitting, ask your health care provider.  Avoid caffeine, alcohol, and certain over-the-counter cold medicines. These may make you feel worse. Ask your pharmacist which medicines to avoid. General instructions  Take over-the-counter and prescription medicines only as told by your health care provider.  Keep all follow-up visits as told by your health care provider. This is important. Where to find support You can get help and support from these sources:  Self-help groups.  Online and community organizations.  A trusted spiritual leader.  Couples counseling.  Family education classes.  Family therapy. Where to find more information You may find that joining a support group helps you deal with your anxiety. The following sources can help you locate counselors or support groups near  you:  Mental Health [place]: www.mentalhealthamerica.net  Anxiety and Depression Association of [place] (ADAA): www.adaa.org  National Alliance on Mental Illness (NAMI): www.nami.org Contact a health care provider if you:  Have a hard time staying focused or finishing daily tasks.  Spend many hours a day feeling worried about everyday life.  Become exhausted by worry.  Start to have headaches, feel tense, or have nausea.  Urinate more than normal.  Have diarrhea. Get help right away if you have:  A racing heart and shortness of breath.  Thoughts of hurting yourself or others. If you ever feel like you may hurt yourself or others, or have thoughts about taking your own life, get help right away. You can go to your nearest emergency department or call:  Your local emergency services (911 in the [place]  A suicide crisis helpline, such as the National Suicide Prevention Lifeline at 1-800-273-8255. This is open 24 hours a day. Summary  Taking steps to learn and use tension reduction techniques can help calm you and help prevent triggering an anxiety reaction.  When used together, medicines, psychotherapy, and tension reduction techniques may be the most effective treatment.  Family, friends, and partners can play a big part in helping you recover from an anxiety disorder. This information is not intended to replace advice given to you by your health care provider. Make sure you discuss any questions you have with your health care provider. Document Revised:   08/01/2018 Document Reviewed: 08/01/2018 Elsevier Patient Education  2020 Elsevier Inc.     Generalized Anxiety Disorder, Adult Generalized anxiety disorder (GAD) is a mental health disorder. People with this condition constantly worry about everyday events. Unlike normal anxiety, worry related to GAD is not triggered by a specific event. These worries also do not fade or get better with time. GAD interferes with life  functions, including relationships, work, and school. GAD can vary from mild to severe. People with severe GAD can have intense waves of anxiety with physical symptoms (panic attacks). What are the causes? The exact cause of GAD is not known. What increases the risk? This condition is more likely to develop in:  Women.  People who have a family history of anxiety disorders.  People who are very shy.  People who experience very stressful life events, such as the death of a loved one.  People who have a very stressful family environment. What are the signs or symptoms? People with GAD often worry excessively about many things in their lives, such as their health and family. They may also be overly concerned about:  Doing well at work.  Being on time.  Natural disasters.  Friendships. Physical symptoms of GAD include:  Fatigue.  Muscle tension or having muscle twitches.  Trembling or feeling shaky.  Being easily startled.  Feeling like your heart is pounding or racing.  Feeling out of breath or like you cannot take a deep breath.  Having trouble falling asleep or staying asleep.  Sweating.  Nausea, diarrhea, or irritable bowel syndrome (IBS).  Headaches.  Trouble concentrating or remembering facts.  Restlessness.  Irritability. How is this diagnosed? Your health care provider can diagnose GAD based on your symptoms and medical history. You will also have a physical exam. The health care provider will ask specific questions about your symptoms, including how severe they are, when they started, and if they come and go. Your health care provider may ask you about your use of alcohol or drugs, including prescription medicines. Your health care provider may refer you to a mental health specialist for further evaluation. Your health care provider will do a thorough examination and may perform additional tests to rule out other possible causes of your symptoms. To be  diagnosed with GAD, a person must have anxiety that:  Is out of his or her control.  Affects several different aspects of his or her life, such as work and relationships.  Causes distress that makes him or her unable to take part in normal activities.  Includes at least three physical symptoms of GAD, such as restlessness, fatigue, trouble concentrating, irritability, muscle tension, or sleep problems. Before your health care provider can confirm a diagnosis of GAD, these symptoms must be present more days than they are not, and they must last for six months or longer. How is this treated? The following therapies are usually used to treat GAD:  Medicine. Antidepressant medicine is usually prescribed for long-term daily control. Antianxiety medicines may be added in severe cases, especially when panic attacks occur.  Talk therapy (psychotherapy). Certain types of talk therapy can be helpful in treating GAD by providing support, education, and guidance. Options include: ? Cognitive behavioral therapy (CBT). People learn coping skills and techniques to ease their anxiety. They learn to identify unrealistic or negative thoughts and behaviors and to replace them with positive ones. ? Acceptance and commitment therapy (ACT). This treatment teaches people how to be mindful as a way to cope  with unwanted thoughts and feelings. ? Biofeedback. This process trains you to manage your body's response (physiological response) through breathing techniques and relaxation methods. You will work with a therapist while machines are used to monitor your physical symptoms.  Stress management techniques. These include yoga, meditation, and exercise. A mental health specialist can help determine which treatment is best for you. Some people see improvement with one type of therapy. However, other people require a combination of therapies. Follow these instructions at home:  Take over-the-counter and prescription  medicines only as told by your health care provider.  Try to maintain a normal routine.  Try to anticipate stressful situations and allow extra time to manage them.  Practice any stress management or self-calming techniques as taught by your health care provider.  Do not punish yourself for setbacks or for not making progress.  Try to recognize your accomplishments, even if they are small.  Keep all follow-up visits as told by your health care provider. This is important. Contact a health care provider if:  Your symptoms do not get better.  Your symptoms get worse.  You have signs of depression, such as: ? A persistently sad, cranky, or irritable mood. ? Loss of enjoyment in activities that used to bring you joy. ? Change in weight or eating. ? Changes in sleeping habits. ? Avoiding friends or family members. ? Loss of energy for normal tasks. ? Feelings of guilt or worthlessness. Get help right away if:  You have serious thoughts about hurting yourself or others. If you ever feel like you may hurt yourself or others, or have thoughts about taking your own life, get help right away. You can go to your nearest emergency department or call:  Your local emergency services (911 in the [place]  A suicide crisis helpline, such as the National Suicide Prevention Lifeline at 431 494 5520. This is open 24 hours a day. Summary  Generalized anxiety disorder (GAD) is a mental health disorder that involves worry that is not triggered by a specific event.  People with GAD often worry excessively about many things in their lives, such as their health and family.  GAD may cause physical symptoms such as restlessness, trouble concentrating, sleep problems, frequent sweating, nausea, diarrhea, headaches, and trembling or muscle twitching.  A mental health specialist can help determine which treatment is best for you. Some people see improvement with one type of therapy. However, other  people require a combination of therapies. This information is not intended to replace advice given to you by your health care provider. Make sure you discuss any questions you have with your health care provider. Document Revised: 02/11/2017 Document Reviewed: 01/20/2016 Elsevier Patient Education  2020 Elsevier Inc.    Agoraphobia Agoraphobia is a mental health disorder in which a person fears going out in public places where he or she may feel helpless, trapped, or embarrassed in the event of a panic attack. People with this condition have a fear of losing control during a panic attack, and they often start to avoid the situations that they fear or insist on having another person go with them. Agoraphobia may interfere with normal daily activities and personal relationships. People with severe agoraphobia may become completely homebound and dependent on others for daily tasks, such as grocery shopping and taking care of errands. Agoraphobia is a type of anxiety. It usually begins before age 89, but it can start in older adult years. People with agoraphobia are at risk for other anxiety disorders, depression,  and substance abuse. What are the causes? The cause of this condition is not known. A variety of factors such as fear of sensations and emotions in anxiety (anxiety sensitivity), family history of anxiety (genetics), and stressful events may contribute to this condition. What increases the risk? You are more likely to develop this condition if:  You are a woman.  You have a panic disorder.  You have family members with agoraphobia. What are the signs or symptoms? You may have agoraphobia if you have any of the following symptoms for 6 months or longer:  Intense fear arising from two or more of the following: ? Using public transportation, such as cars, buses, planes, trains, or ships. ? Being in open spaces, such as parking lots, shopping malls, or bridges. ? Being in enclosed  spaces, such as shops, theaters, or elevators. ? Standing in line or being in a crowd. ? Being outside the home alone.  Fear of being unable to escape or get help if feared events occur. These events include: ? Panic attack. ? Loss of bowel control in older adults.  Reacting to feared situations by: ? Avoiding them. ? Requiring the presence of a companion. ? Enduring them with intense fear or anxiety.  Fear or anxiety that is out of proportion to the actual danger that is posed by the event and the situation. How is this diagnosed? This condition is diagnosed based on:  Your symptoms. You will be asked questions about your fears and how they have affected you.  Your medical history and your use of medicines, alcohol, or drugs.  Physical exam and lab tests. These are usually ordered to rule out other problems that may be causing your symptoms. You may be referred to a mental health specialist (psychiatrist or psychologist). How is this treated? This condition is usually treated using a combination of counseling and medicines.  Counseling or talk therapy. Talk therapy is provided by mental health specialists. The following forms of talk therapy can be especially helpful: ? Cognitive behavioral therapy (CBT). CBT helps you to recognize and change unrealistic thoughts and beliefs that contribute to your fears. You will learn that body changes associated with anxiety (such as increased heart rate and breathing) are completely normal and expected. ? Exposure therapy. This type of therapy helps you to face and overcome your fears in a relaxed state and in a safe environment. Exposures are usually approached in a systematic way, starting with situations that provoke less fear and building up to situations that provoke more intense fear. Exposure therapy includes:  Imagined exposure. You will imagine fearful situations and expose yourself to them in your mind.  In vivo exposure. You will face  your fears in the real world, such as by standing in a crowded place for a few minutes.  Interoceptive exposure. In a safe environment, you will practice experiencing body changes that are associated with panic attacks. One example is breathing through a straw to experience breathlessness.  Medicines. The following types of medicines may be helpful: ? Antidepressants. These can decrease general levels of anxiety and can help to prevent panic attacks. ? Benzodiazepines. These medicines block feelings of anxiety and panic. ? Beta-blockers. Beta blockers can reduce physical symptoms of anxiety, such as sweating, tremors, and a racing heart. They may help you to feel less tense and anxious. Follow these instructions at home: Lifestyle  Try to exercise. Get 150 or more minutes of physical activity each week. Also aim to do strengthening exercises  two or more times a week.  Eat a healthy diet that includes plenty of vegetables, fruits, whole grains, low-fat dairy products, and lean protein. Do not eat a lot of foods that are high in solid fats, added sugars, or salt (sodium).  Get the right amount and quality of sleep. Most adults need 7-9 hours of sleep each night.  Do not drink alcohol.  Do not use illegal drugs. General instructions  Take over-the-counter and prescription medicines only as told by your health care provider.  Keep all follow-up visits as told by your health care provider. This is important. Where to find more information  For more information, visit the website of the Anxiety and Depression Association of [place] (ADAA): ProgramCam.dewww.adaa.org Contact a health care provider if:  Your fear or anxiety gets worse.  You have new fears or anxieties. Get help right away if:  You have trouble breathing or have chest pain that you believe may not be part of a panic attack.  You have serious thoughts about hurting yourself or someone else. If you ever feel like you may hurt yourself  or others, or have thoughts about taking your own life, get help right away. You can go to your nearest emergency department or call:  Your local emergency services (911 in the [place]  A suicide crisis helpline, such as the National Suicide Prevention Lifeline at (405)400-65851-3122721097. This is open 24 hours a day. Summary  Agoraphobia is a type of anxiety disorder that causes a person to avoid situations that he or she fears, such as being in public or being in crowded spaces.  People with agoraphobia often have panic attacks. They may avoid situations in which they feel escape is difficult or panic attacks are likely to occur.  Agoraphobia is treated with medicines or cognitive behavioral therapy (CBT). This information is not intended to replace advice given to you by your health care provider. Make sure you discuss any questions you have with your health care provider. Document Revised: 06/20/2018 Document Reviewed: 10/21/2016 Elsevier Patient Education  2020 Else[employer] Inc.  Preventing Cervical Cancer Cervical cancer is cancer that grows on the cervix. The cervix is at the bottom of the uterus. It connects the uterus to the vagina. The uterus is where a baby develops during pregnancy. Cancer occurs when cells become abnormal and start to grow out of control. If cervical cancer is not found early, it can spread and become dangerous. Cervical cancer cannot always be prevented, but you can take steps to lower your risk of developing this condition. How can this condition affect me? Cervical cancer grows slowly and may not cause any symptoms at first. Over time, the cancer can grow deep into the cervix tissue and spread to other areas. This may take years, and it may happen without you knowing about it. If it is found early, cervical cancer can be treated effectively. If the cancer has grown deep into your cervix or has spread, it will be more difficult to treat. Most cases of cervical cancer are  caused by an STI (sexually transmitted infection) called human papillomavirus (HPV). One way to reduce your risk of cervical cancer is to take steps to avoid infection with the HPV virus. Getting regular Pap tests is also important because this can help identify changes in cells that could lead to cancer. Your chances of getting this disease can also be reduced by making certain lifestyle changes. What can increase my risk? You are more likely to develop this  condition if:  You have certain things in your sexual history, such as: ? Having a sexually transmitted viral infection. These include chlamydia and herpes. ? Having more than one sexual partner, or having sex with someone who has more than one sexual partner. ? Not using condoms during sex. ? Having been sexually active before the age of 91.  Your mother took a medicine called diethylstilbestrol (DES) while pregnant with you, causing you to be exposed to this medicine before birth.  Your mother or sister has had cervical cancer.  You are between the ages of 72-50.  You have or have had certain other medical conditions, such as: ? Previous cancer of the vagina or vulva. ? A weakened body defense system (immune system). ? A history of dysplasia of the cervix.  You use oral contraceptives, also called birth control pills.  You smoke or breathe in secondhand smoke. What actions can I take to prevent cervical cancer? Preventing HPV infection   Ask your health care provider about getting the HPV vaccine. If you are 19 years old or younger, you may need to get this vaccine, which is given in three doses over 6 months. This vaccine protects against the types of HPV that could cause cancer.  Limit the number of people you have sex with. Also avoid having sex with people who have had many sex partners.  Use a latex condom every time you have sex. Getting Pap tests Get Pap tests regularly, starting at age 31. Talk with your health care  provider about how often you need these tests. Having regular Pap tests will help identify changes in cells that could lead to cancer. Steps can then be taken to prevent cancer from developing.  Most women who are 11?24 years of age should have a Pap test every 3 years.  Most women who are 2?24 years of age should have a Pap test in combination with an HPV test every 5 years.  Women with a higher risk of cervical cancer, such as those with a weakened immune system or those who were exposed to DES medicine before birth, may need more frequent testing. Making other lifestyle changes   Do not use any products that contain nicotine or tobacco, such as cigarettes, e-cigarettes, and chewing tobacco. If you need help quitting, ask your health care provider.  Eat a healthy diet that includes at least 5 servings of fruits and vegetables every day.  Lose weight if you are overweight. Where to find support Talk with your health care provider, school nurse, or local health department for guidance about screening and vaccination. Some children and teens may be able to get the HPV vaccine free of charge through the U.S. government's Vaccines for Children [facility]) program. Other places that provide vaccinations include:  Public health clinics. Check with your local health department.  [place], where you would pay only what you can afford. To find one near you, check this website: http://lyons.com/  [place]. These are part of a program for Medicare and Medicaid patients who live in rural areas. The National Breast and Cervical Cancer Early Detection Program also provides breast and cervical cancer screenings and diagnostic services to low-income, uninsured, and underinsured women. Cervical cancer can be passed down through families. Talk with your health care provider or a genetic counselor to learn more about genetic testing for cancer. Where to find more  information Learn more about cervical cancer from:  [employer] of Gynecology: www.acog.org  American Cancer Society: www.cancer.org  Centers for Disease Control and Prevention: FootballExhibition.com.br Contact a health care provider if you have:  Pelvic pain.  Unusual discharge or bleeding from your vagina. Summary  Cervical cancer is cancer that grows on the cervix. The cervix is at the bottom of the uterus.  Ask your health care provider about getting the HPV vaccine.  Be sure to get regular Pap tests as recommended by your health care provider.  See your health care provider right away if you have any pelvic pain or unusual discharge or bleeding from your vagina. This information is not intended to replace advice given to you by your health care provider. Make sure you discuss any questions you have with your health care provider. Document Revised: 10/02/2018 Document Reviewed: 10/02/2018 Elsevier Patient Education  2020 [employer].

## 2019-06-12 NOTE — BH Specialist Note (Signed)
Integrated Behavioral Health Initial Visit  MRN: [id number] Name: Alison Mcbride  Number of Integrated Behavioral Health Clinician visits:: 1/6 Session Start time: 4:25  Session End time: 4:48 Total time: 23  Type of Service: Integrated Behavioral Health- Individual/Family Interpretor:No. Interpretor Name and Language: n/a   Warm Hand Off Completed.       SUBJECTIVE: Alison Mcbride is a 24 y.o. female accompanied by n/a Patient was referred by Donia Ast, NP for anxiety. Patient reports the following symptoms/concerns: Pt states her primary concern today is an increase in feelings of anxiety with panic attacks; is open to learning a self-coping strategy today.  Duration of problem: Ongoing, with increase in recent months; Severity of problem: moderate  OBJECTIVE: Mood: Anxious and Affect: Appropriate Risk of harm to self or others: No plan to harm self or others  LIFE CONTEXT: Family and Social: Pt lives with her 4yo son School/Work: Pt works full-time Self-Care: Recognizing a greater need for self-care Life Changes: -  GOALS ADDRESSED: Patient will: 1. Reduce symptoms of: anxiety and stress 2. Increase knowledge and/or ability of: self-management skills and stress reduction  3. Demonstrate ability to: Increase healthy adjustment to current life circumstances and Increase motivation to adhere to plan of care  INTERVENTIONS: Interventions utilized: Mindfulness or Management consultant, Sleep Hygiene and Psychoeducation and/or Health Education  Standardized Assessments completed: GAD-7 and PHQ 9  ASSESSMENT: Patient currently experiencing Adjustment disorder with anxiety.   Patient may benefit from psychoeducation and brief therapeutic interventions regarding coping with symptoms of anxiety with panic .  PLAN: 1. Follow up with behavioral health clinician on : Two weeks 2. Behavioral recommendations:  -CALM relaxation breathing exercise twice daily (morning; at  bedtime with sleep sounds) -Limit alcoholic drinks to one standard size drink/day for optimal health -Consider apps, as discussed, for additional self-care -Read educational materials regarding coping with symptoms of anxiety with panic  3. Referral(s): [facility] [employer] [facility] (In Clinic)  [address] [place], [place]  Depression screen [facility] 2/9 06/12/2019  Decreased Interest 0  Down, Depressed, Hopeless 0  PHQ - 2 Score 0  Altered sleeping 1  Tired, decreased energy 0  Change in appetite 0  Feeling bad or failure about yourself  0  Trouble concentrating 0  Moving slowly or fidgety/restless 0  Suicidal thoughts 0  PHQ-9 Score 1   GAD 7 : Generalized Anxiety Score 06/12/2019  Nervous, Anxious, on Edge 1  Control/stop worrying 0  Worry too much - different things 1  Trouble relaxing 0  Restless 0  Easily annoyed or irritable 0  Afraid - awful might happen 0  Total GAD 7 Score 2

## 2019-06-12 NOTE — Progress Notes (Signed)
History:  [name] is a 24 y.o. G1P1001 who presents to clinic today for preconception counseling and concerns about anxiety. Patient reports she has been attempting to conceive since December 2020 with a partner that does not live in the same city as she does. Patient reports intercourse approximately 3 times per week. Patient reports she does have a child but her partner was not. He did achieve a pregnancy with another partner, but that pregnancy ended in miscarriage. Patient reports monthly periods with moliminal symptoms. Pt reports PMH includes asthma and denies smoking, drinking or drug use. Patient has been using condoms since the time her Nexplanon was removed in 2017 until December 2020.  Patient also reports anxiety in crowds and feelings of depression since 2019. Patient reports symptoms of depression are worse when she is alone. 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 Patient denies SI/HI.  The following portions of the patient's history were reviewed and updated as appropriate: allergies, current medications, family history, past medical history, social history, past surgical history and problem list.  Review of Systems:  Review of Systems  Constitutional: Negative for chills and fever.  Respiratory: Negative for shortness of breath.   Cardiovascular: Negative for chest pain.  Gastrointestinal: Negative for abdominal pain, diarrhea, nausea and vomiting.  Genitourinary: Negative for dysuria, frequency and urgency.  Neurological: Negative for dizziness and headaches.  Psychiatric/Behavioral: Positive for depression. Negative for substance abuse and suicidal ideas. The patient is nervous/anxious.      Objective:  Physical Exam BP 131/73   Pulse 71   Wt 175 lb 3.2 oz (79.5 kg)   LMP 05/24/2019 (Exact Date)   Breastfeeding No   BMI 29.15 kg/m  Physical Exam  Constitutional: She is oriented to person, place, and time. She appears well-developed and  well-nourished. No distress.  HENT:  Head: Normocephalic and atraumatic.  Respiratory: Effort normal.  Genitourinary: There is no rash, tenderness or lesion on the right labia. There is no rash, tenderness or lesion on the left labia. Uterus is not enlarged and not tender. Cervix exhibits no motion tenderness, no discharge and no friability. Right adnexum displays no mass, no tenderness and no fullness. Left adnexum displays no mass, no tenderness and no fullness.    No vaginal discharge, tenderness or bleeding.  No tenderness or bleeding in the vagina.  Neurological: She is alert and oriented to person, place, and time.  Psychiatric: She has a normal mood and affect. Her behavior is normal. Judgment and thought content normal.   Labs and Imaging No results found for this or any previous visit (from the past 24 hour(s)).  No results found.   Assessment & Plan:   1. Psychosocial stressors - warm hand off to [facility] while patient in clinic - Ambulatory referral to [facility]  2. Anxiety - plan pending Jaime evaluation  3. Encounter for preconception consultation -discussed use of ovulation detector kits and when to return to clinic -discuss infertility work-up after 1 year of being unable to conceive -advised patient to have partner meet with PCP and consider semen analysis -advised intercourse every 2 days or 5 days consecutively after positive ovulation detector kit  4. Screening for cervical cancer - Cytology - PAP( [place])   Approximately 10 minutes of face-to-face time was spent with this patient   Beuna Bolding, Gerrie Nordmann, NP 06/12/2019 4:55 PM      Ovulation detector kit,

## 2019-06-12 NOTE — Patient Instructions (Signed)
Coping with Panic Attacks   What is a panic attack?  You may have had a panic attack if you experienced four or more of the symptoms listed below coming on abruptly and peaking in about 10 minutes.  Panic Symptoms   . Pounding heart  . Sweating  . Trembling or shaking  . Shortness of breath  . Feeling of choking  . Chest pain  . Nausea or abdominal distress    . Feeling dizzy, unsteady, lightheaded, or faint  . Feelings of unreality or being detached from yourself  . Fear of losing control or going crazy  . Fear of dying  . Numbness or tingling  . Chills or hot flashes      Panic attacks are sometimes accompanied by avoidance of certain places or situations. These are often situations that would be difficult to escape from or in which help might not be available. Examples might include crowded shopping malls, public transportation, restaurants, or driving.   Why do panic attacks occur?   Panic attacks are the body's alarm system gone awry. All of us have a built-in alarm system, powered by adrenaline, which increases our heart rate, breathing, and blood flow in response to danger. Ordinarily, this 'danger response system' works well. In some people, however, the response is either out of proportion to whatever stress is going on, or may come out of the blue without any stress at all.   For example, if you are walking in the woods and see a bear coming your way, a variety of changes occur in your body to prepare you to either fight the danger or flee from the situation. Your heart rate will increase to get more blood flow around your body, your breathing rate will quicken so that more oxygen is available, and your muscles will tighten in order to be ready to fight or run. You may feel nauseated as blood flow leaves your stomach area and moves into your limbs. These bodily changes are all essential to helping you survive the dangerous situation. After the danger has passed, your body  functions will begin to go back to normal. This is because your body also has a system for "recovering" by bringing your body back down to a normal state when the danger is over.   As you can see, the emergency response system is adaptive when there is, in fact, a "true" or "real" danger (e.g., bear). However, sometimes people find that their emergency response system is triggered in "everyday" situations where there really is no true physical danger (e.g., in a meeting, in the grocery store, while driving in normal traffic, etc.).   What triggers a panic attack?  Sometimes particularly stressful situations can trigger a panic attack. For example, an argument with your spouse or stressors at work can cause a stress response (activating the emergency response system) because you perceive it as threatening or overwhelming, even if there is no direct risk to your survival.  Sometimes panic attacks don't seem to be triggered by anything in particular- they may "come out of the blue". Somehow, the natural "fight or flight" emergency response system has gotten activated when there is no real danger. Why does the body go into "emergency mode" when there is no real danger?   Often, people with panic attacks are frightened or alarmed by the physical sensations of the emergency response system. First, unexpected physical sensations are experienced (tightness in your chest or some shortness of breath). This then leads to   feeling fearful or alarmed by these symptoms ("Something's wrong!", "Am I having a heart attack?", "Am I going to faint?") The mind perceives that there is a danger even though no real danger exists. This, in turn, activates the emergency response system ("fight or flight"), leading to a "full blown" panic attack. In summary, panic attacks occur when we misinterpret physical symptoms as signs of impending death, craziness, loss of control, embarrassment, or fear of fear. Sometimes you may be aware of  thoughts of danger that activate the emergency response system (for example, thinking "I'm having a heart attack" when you feel chest pressure or increased heart rate). At other times, however, you may not be aware of such thoughts. After several incidences of being afraid of physical sensations, anxiety and panic can occur in response to the initial sensations without conscious thoughts of danger. Instead, you just feel afraid or alarmed. In other words, the panic or fear may seem to occur "automatically" without you consciously telling yourself anything.   After having had one or more panic attacks, you may also become more focused on what is going on inside your body. You may scan your body and be more vigilant about noticing any symptoms that might signal the start of a panic attack. This makes it easier for panic attacks to happen again because you pick up on sensations you might otherwise not have noticed, and misinterpret them as something dangerous. A panic attack may then result.      How do I cope with panic attacks?  An important part of overcoming panic attacks involves re-interpreting your body's physical reactions and teaching yourself ways to decrease the physical arousal. This can be done through practicing the cognitive and behavioral interventions below.   Research has found that over half of people who have panic attacks show some signs of hyperventilation or overbreathing. This can produce initial sensations that alarm you and lead to a panic attack. Overbreathing can also develop as part of the panic attack and make the symptoms worse. When people hyperventilate, certain blood vessels in the body become narrower. In particular, the brain may get slightly less oxygen. This can lead to the symptoms of dizziness, confusion, and lightheadedness that often occur during panic attacks. Other parts of the body may also get a bit less oxygen, which may lead to numbness or tingling in the hands  or feet or the sensation of cold, clammy hands. It also may lead the heart to pump harder. Although these symptoms may be frightening and feel unpleasant, it is important to remember that hyperventilating is not dangerous. However, you can help overcome the unpleasantness of overbreathing by practicing Breathing Retraining.   Practice this basic technique three times a day, every day:  . Inhale. With your shoulders relaxed, inhale as slowly and deeply as you can while you count to six. If you can, use your diaphragm to fill your lungs with air.  . Hold. Keep the air in your lungs as you slowly count to four.  . Exhale. Slowly breath out as you count to six.  . Repeat. Do the inhale-hold-exhale cycle several times. Each time you do it, exhale for longer counts.  Like any new skill, Breathing Retraining requires practice. Try practicing this skill twice a day for several minutes. Initially, do not try this technique in specific situations or when you become frightened or have a panic attack. Begin by practicing in a quiet environment to build up your skill level so that you   can later use it in time of "emergency."   2. Decreasing Avoidance  Regardless of whether you can identify why you began having panic attacks or whether they seemed to come out of the blue, the places where you began having panic attacks often can become triggers themselves. It is not uncommon for individuals to begin to avoid the places where they have had panic attacks. Over time, the individual may begin to avoid more and more places, thereby decreasing their activities and often negatively impacting their quality of life. To break the cycle of avoidance, it is important to first identify the places or situations that are being avoided, and then to do some "relearning."  To begin this intervention, first create a list of locations or situations that you tend to avoid. Then choose an avoided location or situation that you would like  to target first. Now develop an "exposure hierarchy" for this situation or location. An "exposure hierarchy" is a list of actions that make you feel anxious in this situation. Order these actions from least to most anxiety-producing. It is often helpful to have the first item on your hierarchy involve thinking or imagining part of the feared/avoided situation.   Here is an example of an exposure hierarchy for decreasing avoidance of the grocery store. Note how it is ordered from the least amount of anxiety (at the top) to the most anxiety (at the bottom):  Marland Kitchen Think about going to the grocery store alone.  . Go to the grocery store with a friend or family member.  . Go to the grocery store alone to pick up a few small items (5-10 minutes in the store).  . Shopping for 10-20 minutes in the store alone.  . Doing the shopping for the week by myself (20-30 minutes in the store).   Your homework is to "expose" yourself to the lowest item on your hierarchy and use your breathing relaxation and coping statements (see below) to help you remain in the situation. Practice this several times during the upcoming week. Once you have mastered each item with minimal anxiety, move on to the next higher action on your list.   Cognitive Interventions  1. Identify your negative self-talk Anxious thoughts can increase anxiety symptoms and panic. The first step in changing anxious thinking is to identify your own negative, alarming self-talk. Some common alarming thoughts:  . I'm having a heart attack.            . I must be going crazy. . I think I'm dying. Marland Kitchen People will think I'm crazy. . I'm going to pass our.  . Oh no- here it comes.  . I can't stand this.  Rene Paci got to get out of here!  2. Use positive coping statements Changing or disrupting a pattern of anxious thoughts by replacing them with more calming or supportive statements can help to divert a panic attack. Some common helpful coping statements:   . This is not an emergency.  . I don't like feeling this way, but I can accept it.  . I can feel like this and still be okay.  . This has happened before, and I was okay. I'll be okay this time, too.  . I can be anxious and still deal with this situation.       /Emotional Wellbeing Apps and Websites Here are a few free apps meant to help you to help yourself.  To find, try searching on the internet to see if  the app is offered on Apple/Android devices. If your first choice doesn't come up on your device, the good news is that there are many choices! Play around with different apps to see which ones are helpful to you.    Calm This is an app meant to help increase calm feelings. Includes info, strategies, and tools for tracking your feelings.      Calm Harm  This app is meant to help with self-harm. Provides many 5-minute or 15-min coping strategies for doing instead of hurting yourself.       Healthy Minds Health Minds is a problem-solving tool to help deal with emotions and cope with stress you encounter wherever you are.      MindShift This app can help people cope with anxiety. Rather than trying to avoid anxiety, you can make an important shift and face it.      MY3  MY3 features a support system, safety plan and resources with the goal of offering a tool to use in a time of need.       My Life My Voice  This mood journal offers a simple solution for tracking your thoughts, feelings and moods. Animated emoticons can help identify your mood.       Relax Melodies Designed to help with sleep, on this app you can mix sounds and meditations for relaxation.      Smiling Mind Smiling Mind is meditation made easy: it's a simple tool that helps put a smile on your mind.        Stop, Breathe & Think  A friendly, simple guide for people through meditations for mindfulness and compassion.  Stop, Breathe and Think Kids Enter your current feelings and choose a  "mission" to help you cope. Offers videos for certain moods instead of just sound recordings.       Team Orange The goal of this tool is to help teens change how they think, act, and react. This app helps you focus on your own good feelings and experiences.      The [employer] Box The [employer] (V[employer]) contains simple tools to help patients with coping, relaxation, distraction, and positive thinking.    Primary care offices accepting new patients:   Primary Care at [facility] [address] [place], [place] [address] [phone]  [facility] at [facility] [address] [place], [place] [address] [phone]  [facility] and [facility] [address] [place], [place] [address] [phone]  [facility] [address] [place], [place] [address] [phone]

## 2019-06-14 LAB — CYTOLOGY - PAP: Diagnosis: NEGATIVE

## 2019-07-17 ENCOUNTER — Encounter: Payer: Self-pay | Admitting: *Deleted

## 2019-11-27 ENCOUNTER — Other Ambulatory Visit: Payer: Self-pay

## 2019-11-27 ENCOUNTER — Other Ambulatory Visit: Payer: Medicaid Other

## 2019-11-27 DIAGNOSIS — Z20822 Contact with and (suspected) exposure to covid-19: Secondary | ICD-10-CM

## 2019-11-29 LAB — NOVEL CORONAVIRUS, NAA: SARS-CoV-2, NAA: NOT DETECTED

## 2019-11-29 LAB — SARS-COV-2, NAA 2 DAY TAT

## 2019-12-25 ENCOUNTER — Other Ambulatory Visit: Payer: Medicaid Other

## 2019-12-25 DIAGNOSIS — Z20822 Contact with and (suspected) exposure to covid-19: Secondary | ICD-10-CM

## 2019-12-26 LAB — NOVEL CORONAVIRUS, NAA: SARS-CoV-2, NAA: NOT DETECTED

## 2019-12-26 LAB — SARS-COV-2, NAA 2 DAY TAT

## 2020-07-29 ENCOUNTER — Encounter: Payer: Self-pay | Admitting: Family Medicine

## 2020-07-29 ENCOUNTER — Ambulatory Visit (INDEPENDENT_AMBULATORY_CARE_PROVIDER_SITE_OTHER): Payer: Medicaid Other

## 2020-07-29 VITALS — BP 130/81 | HR 89 | Wt 155.6 lb

## 2020-07-29 DIAGNOSIS — Z3201 Encounter for pregnancy test, result positive: Secondary | ICD-10-CM | POA: Diagnosis not present

## 2020-07-29 LAB — POCT PREGNANCY, URINE: Preg Test, Ur: POSITIVE — AB

## 2020-07-29 NOTE — Progress Notes (Addendum)
Pt here today for pregnancy test resulting positive. Pt denies vaginal bleeding and pain.  Pt reports LMP 06/23/20 making her 5w 1d and EDD 03/30/21.   Medications/allergies reviewed.  Pt advised to start prenatal care and PNV.   Front office notified to schedule NEW OB intake and provider appt.    Addison Naegeli, RN  07/29/20   Chart reviewed for nurse visit. Agree with plan of care.   Currie Paris, NP 07/29/2020 2:37 PM

## 2020-08-21 ENCOUNTER — Encounter (HOSPITAL_BASED_OUTPATIENT_CLINIC_OR_DEPARTMENT_OTHER): Payer: Self-pay | Admitting: Obstetrics & Gynecology

## 2020-08-21 ENCOUNTER — Ambulatory Visit (INDEPENDENT_AMBULATORY_CARE_PROVIDER_SITE_OTHER): Payer: Medicaid Other | Admitting: *Deleted

## 2020-08-21 ENCOUNTER — Encounter (HOSPITAL_BASED_OUTPATIENT_CLINIC_OR_DEPARTMENT_OTHER): Payer: Self-pay

## 2020-08-21 ENCOUNTER — Ambulatory Visit (INDEPENDENT_AMBULATORY_CARE_PROVIDER_SITE_OTHER): Payer: Medicaid Other | Admitting: Obstetrics & Gynecology

## 2020-08-21 ENCOUNTER — Other Ambulatory Visit: Payer: Self-pay

## 2020-08-21 ENCOUNTER — Other Ambulatory Visit (HOSPITAL_BASED_OUTPATIENT_CLINIC_OR_DEPARTMENT_OTHER)
Admission: RE | Admit: 2020-08-21 | Discharge: 2020-08-21 | Disposition: A | Discharge status: Home / Self Care | Payer: Medicaid Other | Source: Ambulatory Visit | Attending: Obstetrics & Gynecology | Admitting: Obstetrics & Gynecology

## 2020-08-21 VITALS — BP 128/82 | HR 74 | Ht 64.0 in | Wt 160.0 lb

## 2020-08-21 DIAGNOSIS — Z3A08 8 weeks gestation of pregnancy: Secondary | ICD-10-CM

## 2020-08-21 DIAGNOSIS — Z1331 Encounter for screening for depression: Secondary | ICD-10-CM | POA: Insufficient documentation

## 2020-08-21 DIAGNOSIS — F1290 Cannabis use, unspecified, uncomplicated: Secondary | ICD-10-CM

## 2020-08-21 DIAGNOSIS — Z3481 Encounter for supervision of other normal pregnancy, first trimester: Secondary | ICD-10-CM

## 2020-08-21 DIAGNOSIS — Z3480 Encounter for supervision of other normal pregnancy, unspecified trimester: Secondary | ICD-10-CM | POA: Insufficient documentation

## 2020-08-21 DIAGNOSIS — Z331 Pregnant state, incidental: Secondary | ICD-10-CM

## 2020-08-21 DIAGNOSIS — Z8659 Personal history of other mental and behavioral disorders: Secondary | ICD-10-CM

## 2020-08-21 LAB — POCT URINALYSIS DIPSTICK OB
Appearance: NORMAL
Bilirubin, UA: NEGATIVE
Blood, UA: NEGATIVE
Glucose, UA: NEGATIVE
Ketones, UA: NEGATIVE
Leukocytes, UA: NEGATIVE
Nitrite, UA: NEGATIVE
POC,PROTEIN,UA: NEGATIVE
Spec Grav, UA: 1.02 (ref 1.010–1.025)
Urobilinogen, UA: 0.2 E.U./dL
pH, UA: 7 (ref 5.0–8.0)

## 2020-08-21 LAB — CBC
HCT: 36.3 % (ref 36.0–46.0)
Hemoglobin: 12 g/dL (ref 12.0–15.0)
MCH: 28.7 pg (ref 26.0–34.0)
MCHC: 33.1 g/dL (ref 30.0–36.0)
MCV: 86.8 fL (ref 80.0–100.0)
Platelets: 333 10*3/uL (ref 150–400)
RBC: 4.18 MIL/uL (ref 3.87–5.11)
RDW: 15.1 % (ref 11.5–15.5)
WBC: 4.3 10*3/uL (ref 4.0–10.5)
nRBC: 0 % (ref 0.0–0.2)

## 2020-08-21 LAB — HEPATITIS B SURFACE ANTIGEN: Hepatitis B Surface Ag: NONREACTIVE

## 2020-08-21 LAB — ABO/RH: ABO/RH(D): O POS

## 2020-08-21 LAB — ANTIBODY SCREEN: Antibody Screen: NEGATIVE

## 2020-08-21 LAB — HEPATITIS C ANTIBODY: HCV Ab: NONREACTIVE

## 2020-08-21 LAB — HIV ANTIBODY (ROUTINE TESTING W REFLEX): HIV Screen 4th Generation wRfx: NONREACTIVE

## 2020-08-21 MED ORDER — BLOOD PRESSURE KIT DEVI: 1.0000 | 0 refills | Status: DC

## 2020-08-21 NOTE — Progress Notes (Signed)
History:   Alison Mcbride is a 25 y.o. G2P1001 at [redacted]w[redacted]d by LMP being seen today for her first obstetrical visit.  Her obstetrical history is significant for  histoyr of postpartum depression with prior pregnancy . Patient does intend to breast feed. Pregnancy history fully reviewed.  Patient reports no complaints.  Depression screen positive today with PHQ of 11.  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  Never took medication.  Not interested right now.  Concerned about housing costs.  This is a stressor for her.  Social work referral will be placed today.     HISTORY: OB History  Gravida Para Term Preterm AB Living  2 1 1  0 0 1  SAB IAB Ectopic Multiple Live Births  0 0 0 0 1    # Outcome Date GA Lbr Len/2nd Weight Sex Delivery Anes PTL Lv  2 Current           1 Term 09/30/14 [redacted]w[redacted]d 05:12 / 00:09 7 lb 0.4 oz (3.185 kg) M Vag-Spont EPI  LIV     Apgar1: 7  Apgar5: 9    Last pap smear was done 06/12/2019 and was normal  Past Medical History:  Diagnosis Date   Anxiety    Asthma    Seasonal allergies    Past Surgical History:  Procedure Laterality Date   ANKLE SURGERY     WISDOM TOOTH EXTRACTION     Family History  Problem Relation Age of Onset   Hypertension Mother    Diabetes Mother    Hypertension Father    Asthma Son    Lupus Maternal Aunt    Cancer Maternal Grandmother    Cancer Maternal Grandfather        liver   Social History   Tobacco Use   Smoking status: Never   Smokeless tobacco: Never  Vaping Use   Vaping Use: Former  Substance Use Topics   Alcohol use: Not Currently    Comment: occasional   Drug use: Yes    Types: Marijuana   No Known Allergies Current Outpatient Medications on File Prior to Visit  Medication Sig Dispense Refill   albuterol (PROVENTIL HFA;VENTOLIN HFA) 108 (90 Base) MCG/ACT inhaler Inhale 1-2 puffs into the lungs every 6 (six) hours as needed for wheezing or shortness of breath. 1 Inhaler 0   Multiple Vitamins-Minerals (WOMENS  MULTIVITAMIN PO) Take by mouth.     No current facility-administered medications on file prior to visit.    Review of Systems Pertinent items noted in HPI and remainder of comprehensive ROS otherwise negative.  Physical Exam:   Vitals:   08/21/20 0829 08/21/20 0830  BP: 128/82   Pulse: 74   Weight: 160 lb (72.6 kg)   Height:  5\' 4"  (1.626 m)     Bedside Ultrasound for FHR check: Viable intrauterine pregnancy with positive cardiac activity noted, fetal heart rate 130 bpm Patient informed that the ultrasound is considered a limited obstetric ultrasound and is not intended to be a complete ultrasound exam.  Patient also informed that the ultrasound is not being completed with the intent of assessing for fetal or placental anomalies or any pelvic abnormalities.  Explained that the purpose of today's ultrasound is to assess for fetal heart rate.  Patient acknowledges the purpose of the exam and the limitations of the study. General: well-developed, well-nourished female in no acute distress  Breasts:  normal appearance, no masses or tenderness bilaterally  Skin: normal coloration and turgor, no rashes  Neurologic:  oriented, normal, negative, normal mood  Extremities: normal strength, tone, and muscle mass, ROM of all joints is normal  HEENT PERRLA, extraocular movement intact and sclera clear, anicteric  Neck supple and no masses  Cardiovascular: regular rate and rhythm  Respiratory:  no respiratory distress, normal breath sounds          Assessment:    Pregnancy: G2P1001 Patient Active Problem List   Diagnosis Date Noted   Supervision of normal intrauterine pregnancy in multigravida 08/21/2020     Plan:    1. Supervision of normal intrauterine pregnancy in multigravida in first trimester - Ambulatory referral to Integrated Behavioral [facility]  2. [redacted] weeks gestation of pregnancy - ABO/Rh; Future - Antibody screen; Future - CBC; Future - Hepatitis B surface antigen; Future -  HIV Antibody (routine testing w rflx); Future - HIV (Save tube for possible reflex); Future - RPR; Future - Rubella screen; Future - Hepatitis C antibody; Future - Urine Culture; Future - POC Urinalysis Dipstick OB - Babyscripts Schedule Optimization - Cervicovaginal ancillary only( [place])  3. Pregnant state, incidental  4. Positive depression screening - Ambulatory referral to Integrated Behavioral Health   Initial labs drawn. Continue prenatal vitamins. Problem list reviewed and updated. Genetic Screening discussed, NIPS:  desires but too early to draw today . Ultrasound discussed; fetal anatomic survey: requested. Anticipatory guidance about prenatal visits given including labs, ultrasounds, and testing. Discussed usage of Babyscripts and virtual visits as additional source of managing and completing prenatal visits in midst of coronavirus and pandemic.   Encouraged to complete MyChart Registration for her ability to review results, send requests, and have questions addressed.  The nature of [facility] Practice with multiple MDs and Advanced Practice Providers was explained to patient; also emphasized that residents, students are part of our team. Routine obstetric precautions reviewed. Encouraged to seek out care at office or emergency room [facility] preferred) for urgent and/or emergent concerns. Return in about 4 weeks (around 09/18/2020) for Office ob visit (MD or APP).     Lum Keas, MD, FACOG Obstetrician & Gynecologist, [facility] for [facility], [facility]

## 2020-08-21 NOTE — Progress Notes (Signed)
Pt here for New OB visit. Information packet given to and reviewed with patient.

## 2020-08-22 LAB — URINE CULTURE: Culture: NO GROWTH

## 2020-08-22 LAB — RUBELLA SCREEN: Rubella: 5.17 index (ref >0.99)

## 2020-08-22 LAB — RPR: RPR Ser Ql: NONREACTIVE

## 2020-09-18 ENCOUNTER — Other Ambulatory Visit (HOSPITAL_BASED_OUTPATIENT_CLINIC_OR_DEPARTMENT_OTHER)
Admission: RE | Admit: 2020-09-18 | Discharge: 2020-09-18 | Disposition: A | Discharge status: Home / Self Care | Payer: Medicaid Other | Source: Ambulatory Visit | Attending: Obstetrics & Gynecology | Admitting: Obstetrics & Gynecology

## 2020-09-18 ENCOUNTER — Other Ambulatory Visit (HOSPITAL_COMMUNITY)
Admission: RE | Admit: 2020-09-18 | Discharge: 2020-09-18 | Disposition: A | Discharge status: Home / Self Care | Payer: Medicaid Other | Source: Ambulatory Visit | Attending: Obstetrics & Gynecology | Admitting: Obstetrics & Gynecology

## 2020-09-18 ENCOUNTER — Encounter (HOSPITAL_BASED_OUTPATIENT_CLINIC_OR_DEPARTMENT_OTHER): Payer: Self-pay | Admitting: *Deleted

## 2020-09-18 ENCOUNTER — Other Ambulatory Visit: Payer: Self-pay

## 2020-09-18 ENCOUNTER — Encounter (HOSPITAL_BASED_OUTPATIENT_CLINIC_OR_DEPARTMENT_OTHER): Payer: Self-pay | Admitting: Obstetrics & Gynecology

## 2020-09-18 ENCOUNTER — Ambulatory Visit (INDEPENDENT_AMBULATORY_CARE_PROVIDER_SITE_OTHER): Payer: Medicaid Other | Admitting: Obstetrics & Gynecology

## 2020-09-18 VITALS — BP 137/83 | HR 92 | Wt 164.8 lb

## 2020-09-18 DIAGNOSIS — Z3481 Encounter for supervision of other normal pregnancy, first trimester: Secondary | ICD-10-CM | POA: Diagnosis not present

## 2020-09-18 DIAGNOSIS — Z3A12 12 weeks gestation of pregnancy: Secondary | ICD-10-CM | POA: Insufficient documentation

## 2020-09-18 NOTE — Addendum Note (Signed)
Addended by: Harrie Jeans on: 09/18/2020 02:19 PM   Modules accepted: Orders

## 2020-09-18 NOTE — Addendum Note (Signed)
Addended by: Jerene Bears on: 09/18/2020 01:54 PM   Modules accepted: Orders

## 2020-09-18 NOTE — Progress Notes (Signed)
   PRENATAL VISIT NOTE  Subjective:  Alison Mcbride is a 25 y.o. G2P1001 at [redacted]w[redacted]d being seen today for ongoing prenatal care.  She is currently monitored for the following issues for this low-risk pregnancy and has Supervision of normal intrauterine pregnancy in multigravida; History of depression; Marijuana use; and Positive depression screening on their problem list.  Patient reports  some cramping but no vaginal bleeding .  Contractions: Irritability. Vag. Bleeding: None.  Movement: Absent. Denies leaking of fluid.   The following portions of the patient's history were reviewed and updated as appropriate: allergies, current medications, past family history, past medical history, past social history, past surgical history and problem list.   Objective:   Vitals:   09/18/20 1317  BP: 137/83  Pulse: 92  Weight: 164 lb 12.8 oz (74.8 kg)    Fetal Status: Fetal Heart Rate (bpm): 153   Movement: Absent     General:  Alert, oriented and cooperative. Patient is in no acute distress.  Skin: Skin is warm and dry. No rash noted.   Cardiovascular: Normal heart rate noted  Respiratory: Normal respiratory effort, no problems with respiration noted  Abdomen: Soft, gravid, appropriate for gestational age.  Pain/Pressure: Absent     Pelvic: Cervical exam deferred        Extremities: Normal range of motion.  Edema: None  Mental Status: Normal mood and affect. Normal behavior. Normal judgment and thought content.   Assessment and Plan:  Pregnancy: G2P1001 at [redacted]w[redacted]d 1. Supervision of normal intrauterine pregnancy in multigravida in first trimester - on PNV  2. [redacted] weeks gestation of pregnancy - anatomy u/s ordered - Cervicovaginal ancillary only( [place]) - Genetic Screening  Preterm labor symptoms and general obstetric precautions including but not limited to vaginal bleeding, contractions, leaking of fluid and fetal movement were reviewed in detail with the patient. Please refer to After  Visit Summary for other counseling recommendations.   No follow-ups on file.  No future appointments.

## 2020-09-19 LAB — CERVICOVAGINAL ANCILLARY ONLY
Chlamydia: NEGATIVE
Comment: NEGATIVE
Comment: NORMAL
Neisseria Gonorrhea: NEGATIVE

## 2020-10-02 ENCOUNTER — Encounter (HOSPITAL_BASED_OUTPATIENT_CLINIC_OR_DEPARTMENT_OTHER): Payer: Self-pay | Admitting: Obstetrics & Gynecology

## 2020-10-13 ENCOUNTER — Encounter (HOSPITAL_BASED_OUTPATIENT_CLINIC_OR_DEPARTMENT_OTHER): Payer: Self-pay

## 2020-10-13 NOTE — Telephone Encounter (Signed)
Spoke with patient regarding [employer]. She states that she was having severe back pain last night that she described as a feeling of spine needing to be popped. She denies lower back pain in the kidney area. She says it has eased up some but is still present. Pt denies vaginal bleeding, contractions, unusual vaginal discharge or odor. Pt is requesting a note for weight lifting restrictions at work. Advised that I would check with provider and respond via MyChart with recommendations. Advised pt to call us back if back pain increases or does not improve. Pt verbalized understanding.

## 2020-10-20 ENCOUNTER — Encounter (HOSPITAL_BASED_OUTPATIENT_CLINIC_OR_DEPARTMENT_OTHER): Payer: Medicaid Other | Admitting: Obstetrics & Gynecology

## 2020-10-21 ENCOUNTER — Other Ambulatory Visit: Payer: Self-pay

## 2020-10-21 ENCOUNTER — Ambulatory Visit (INDEPENDENT_AMBULATORY_CARE_PROVIDER_SITE_OTHER): Payer: Medicaid Other | Admitting: Obstetrics & Gynecology

## 2020-10-21 VITALS — BP 142/80 | HR 94 | Wt 170.0 lb

## 2020-10-21 DIAGNOSIS — Z3481 Encounter for supervision of other normal pregnancy, first trimester: Secondary | ICD-10-CM

## 2020-10-21 DIAGNOSIS — R030 Elevated blood-pressure reading, without diagnosis of hypertension: Secondary | ICD-10-CM

## 2020-10-21 DIAGNOSIS — Z8659 Personal history of other mental and behavioral disorders: Secondary | ICD-10-CM

## 2020-10-21 DIAGNOSIS — R011 Cardiac murmur, unspecified: Secondary | ICD-10-CM

## 2020-10-21 DIAGNOSIS — Z3A17 17 weeks gestation of pregnancy: Secondary | ICD-10-CM

## 2020-10-21 NOTE — Progress Notes (Signed)
PRENATAL VISIT NOTE  Subjective:  Alison Mcbride is a 25 y.o. G2P1001 at [redacted]w[redacted]d being seen today for ongoing prenatal care.  She is currently monitored for the following issues for this low-risk pregnancy and has Supervision of normal intrauterine pregnancy in multigravida; History of depression; Marijuana use; and Positive depression screening on their problem list.  Patient reports  feeling lightheaded today.  No headache.  No visual changes.  Feels she is getting adequate fluid.  FOB accompanies her today.   Contractions: Not present. Vag. Bleeding: None.  Movement: Present. Denies leaking of fluid.   Pt has recently strained back.  Did not see anyone for this.  With pregnancy, continues to be an issue if doing lifting especially with work.  Requests a light duty work Doctor, general practice.  This was written last week by RN in office but pt states needs more details. This was written with her in the office today.  Letter will be printed for her and signed as well as sent to mychart.  The following portions of the patient's history were reviewed and updated as appropriate: allergies, current medications, past family history, past medical history, past social history, past surgical history and problem list.   Objective:   Vitals:   10/21/20 1318  BP: (!) 142/80  Pulse: 94  Weight: 170 lb (77.1 kg)    Fetal Status: Fetal Heart Rate (bpm): 140 Fundal Height: 17 cm Movement: Present     General:  Alert, oriented and cooperative. Patient is in no acute distress.  Skin: Skin is warm and dry. No rash noted.   Cardiovascular: Normal heart rate noted, 3/6 systolic murmur noted  Respiratory: Normal respiratory effort, no problems with respiration noted  Abdomen: Soft, gravid, appropriate for gestational age.  Pain/Pressure: Present     Pelvic: Cervical exam deferred        Extremities: Normal range of motion.  Edema: None  Mental Status: Normal mood and affect. Normal behavior. Normal judgment and  thought content.   Assessment and Plan:  Pregnancy: G2P1001 at [redacted]w[redacted]d 1. [redacted] weeks gestation of pregnancy - on PNV - anatomy scan is planned - needs AFP.  Due to feeling lightheaded today, will plan to draw at next visit - light duty accomodation letter written as per above.  2. Supervision of normal intrauterine pregnancy in multigravida in first trimester  3. Heart murmur - referral to cardiology placed today  4. History of depression  5. Elevated BP without diagnosis of hypertension - recheck BP 1 month  Preterm labor symptoms and general obstetric precautions including but not limited to vaginal bleeding, contractions, leaking of fluid and fetal movement were reviewed in detail with the patient. Please refer to After Visit Summary for other counseling recommendations.   Return in about 4 weeks (around 11/18/2020).  Future Appointments  Date Time Provider Department Center  10/27/2020  8:00 AM [facility] NURSE [facility]  11/03/2020 12:45 PM [facility]  11/20/2020 11:45 AM Jerene Bears, MD [facility]  12/16/2020  9:45 AM Jerene Bears, MD [facility]  01/13/2021 10:15 AM Jerene Bears, MD [facility]  02/09/2021  8:30 AM Jerene Bears, MD [facility]  02/26/2021  9:15 AM Jerene Bears, MD [facility]  03/05/2021 10:30 AM Jerene Bears, MD [facility]  03/17/2021  8:45 AM Jerene Bears, MD [facility]  03/23/2021  8:30 AM Jerene Bears, MD [facility]  03/30/2021  8:45 AM Jerene Bears, MD [facility]    Jerene Bears, MD

## 2020-10-27 ENCOUNTER — Ambulatory Visit (HOSPITAL_BASED_OUTPATIENT_CLINIC_OR_DEPARTMENT_OTHER): Payer: Medicaid Other

## 2020-10-27 ENCOUNTER — Encounter (HOSPITAL_BASED_OUTPATIENT_CLINIC_OR_DEPARTMENT_OTHER): Payer: Self-pay

## 2020-10-27 ENCOUNTER — Telehealth (HOSPITAL_BASED_OUTPATIENT_CLINIC_OR_DEPARTMENT_OTHER): Payer: Self-pay

## 2020-10-27 NOTE — Telephone Encounter (Signed)
Patient "Alison Mcbride" for her 8:00 appointment. Called patient to check on her and she states she did not come in today because she does not feel well. I explained to the patient, this is why we would like to keep a check on your blood pressure as it has been running a little high. Patient states she was going to call and reschedule. I encouraged her to still try and come today. Patient states she will come in at 1:30. She has been put on the schedule. tbw

## 2020-10-28 NOTE — Telephone Encounter (Signed)
Patient did not show for her 1:30 Nurse Visit for blood pressure check on 10/27/2020.  I called and LMOM at 10:25 for patient to give [place] a call. We were calling to check on her to see how she was feeling and to reschedule blood pressure check. tbw

## 2020-10-30 DIAGNOSIS — F419 Anxiety disorder, unspecified: Secondary | ICD-10-CM | POA: Insufficient documentation

## 2020-10-30 DIAGNOSIS — J45909 Unspecified asthma, uncomplicated: Secondary | ICD-10-CM | POA: Insufficient documentation

## 2020-10-30 DIAGNOSIS — J302 Other seasonal allergic rhinitis: Secondary | ICD-10-CM | POA: Insufficient documentation

## 2020-10-31 ENCOUNTER — Other Ambulatory Visit: Payer: Self-pay

## 2020-10-31 ENCOUNTER — Encounter: Payer: Self-pay | Admitting: Cardiology

## 2020-10-31 ENCOUNTER — Ambulatory Visit (INDEPENDENT_AMBULATORY_CARE_PROVIDER_SITE_OTHER): Payer: Medicaid Other | Admitting: Cardiology

## 2020-10-31 VITALS — BP 119/72 | HR 74 | Ht 64.0 in | Wt 178.0 lb

## 2020-10-31 DIAGNOSIS — R0602 Shortness of breath: Secondary | ICD-10-CM | POA: Diagnosis not present

## 2020-10-31 DIAGNOSIS — I5031 Acute diastolic (congestive) heart failure: Secondary | ICD-10-CM | POA: Diagnosis not present

## 2020-10-31 NOTE — Progress Notes (Signed)
Cardio-Obstetrics Clinic  New Evaluation  Date:  11/01/2020   ID:  Alison Mcbride, DOB 27-Sep-1995, MRN [id number]  PCP:  Pcp, No   [facility] Providers Cardiologist:  Thomasene RippleKardie Hagan Vanauken, DO  Electrophysiologist:  None       Referring MD: Jerene BearsMiller, Mary S, MD   Chief Complaint:   History of Present Illness:    Alison Mcbride is a 25 y.o. female [G2P1001] who is being seen today for the evaluation of heart murmur at the request of Jerene BearsMiller, Mary S, MD.  She has a medical history of anxiety, and asthma.  The patient reports that she has been experiencing some shortness of breath which is progressing.  Most of which is new.  On her recent visit she was noted to have significant murmur than her expected pregnancy physiologic murmur.  With this she was referred to the cardio obstetrics clinic.  She denies any chest pain.  She has never seen a cardiologist before.  She is otherwise active person she tells me even back in her high school days she played softball.  She denies any syncope or any lightheadedness or dizziness.  She is never had any syncope episodes despite her activities as a child and teenager.  Prior CV Studies Reviewed: The following studies were reviewed today:   Past Medical History:  Diagnosis Date   Anxiety    Asthma    Seasonal allergies     Past Surgical History:  Procedure Laterality Date   ANKLE SURGERY     WISDOM TOOTH EXTRACTION        OB History     Gravida  2   Para  1   Term  1   Preterm      AB      Living  1      SAB      IAB      Ectopic      Multiple  0   Live Births  1               Current Medications: Current Meds  Medication Sig   albuterol (PROVENTIL HFA;VENTOLIN HFA) 108 (90 Base) MCG/ACT inhaler Inhale 1-2 puffs into the lungs every 6 (six) hours as needed for wheezing or shortness of breath.   Multiple Vitamins-Minerals (WOMENS MULTIVITAMIN PO) Take by mouth.     Allergies:   Patient has no known allergies.    Social History   Socioeconomic History   Marital status: Single    Spouse name: Not on file   Number of children: Not on file   Years of education: Not on file   Highest education level: Not on file  Occupational History   Not on file  Tobacco Use   Smoking status: Never   Smokeless tobacco: Never   Tobacco comments:    Smokes marijuana  Vaping Use   Vaping Use: Former  Substance and Sexual Activity   Alcohol use: Not Currently    Comment: occasional   Drug use: Yes    Types: Marijuana   Sexual activity: Yes    Birth control/protection: None  Other Topics Concern   Not on file  Social History Narrative   Not on file   Social Determinants of Health   Financial Resource Strain: High Risk   Difficulty of Paying Living Expenses: Hard  Food Insecurity: No Food Insecurity   Worried About Running Out of Food in the Last Year: Never true   Ran Out of Food in the Last Year:  Never true  Transportation Needs: No Transportation Needs   Lack of Transportation (Medical): No   Lack of Transportation (Non-Medical): No  Physical Activity: Sufficiently Active   Days of Exercise per Week: 5 days   Minutes of Exercise per Session: 100 min  Stress: Stress Concern Present   Feeling of Stress : To some extent  Social Connections: Unknown   Frequency of Communication with Friends and Family: More than three times a week   Frequency of Social Gatherings with Friends and Family: Twice a week   Attends Religious Services: 1 to 4 times per year   Active Member of [employer] or Organizations: No   Attends Engineer, structural: Never   Marital Status: Not on file      Family History  Problem Relation Age of Onset   Hypertension Mother    Diabetes Mother    Hypertension Father    Asthma Son    Lupus Maternal Aunt    Cancer Maternal Grandmother    Cancer Maternal Grandfather        liver      ROS:   Please see the history of present illness.     Review of Systems   Constitution: Negative for decreased appetite, fever and weight gain.  HENT: Negative for congestion, ear discharge, hoarse voice and sore throat.   Eyes: Negative for discharge, redness, vision loss in right eye and visual halos.  Cardiovascular: Reports shortness of breath.  Negative for chest pain, leg swelling, orthopnea and palpitations.  Respiratory: Negative for cough, hemoptysis, shortness of breath and snoring.   Endocrine: Negative for heat intolerance and polyphagia.  Hematologic/Lymphatic: Negative for bleeding problem. Does not bruise/bleed easily.  Skin: Negative for flushing, nail changes, rash and suspicious lesions.  Musculoskeletal: Negative for arthritis, joint pain, muscle cramps, myalgias, neck pain and stiffness.  Gastrointestinal: Negative for abdominal pain, bowel incontinence, diarrhea and excessive appetite.  Genitourinary: Negative for decreased libido, genital sores and incomplete emptying.  Neurological: Negative for brief paralysis, focal weakness, headaches and loss of balance.  Psychiatric/Behavioral: Negative for altered mental status, depression and suicidal ideas.  Allergic/Immunologic: Negative for HIV exposure and persistent infections.     Labs/EKG Reviewed:    EKG:   EKG is was ordered today.  The ekg ordered today demonstrates NSR, heart rate 74 bpm with arrhythmia.  Recent Labs: 08/21/2020: Hemoglobin 12.0; Platelets 333   Recent Lipid Panel No results found for: CHOL, TRIG, HDL, CHOLHDL, LDLCALC, LDLDIRECT  Physical Exam:    VS:  BP 119/72 (BP Location: Right Arm, Patient Position: Sitting, Cuff Size: Normal)   Pulse 74   Ht 5\' 4"  (1.626 m)   Wt 178 lb (80.7 kg)   LMP 06/23/2020   SpO2 100%   BMI 30.55 kg/m     Wt Readings from Last 3 Encounters:  10/31/20 178 lb (80.7 kg)  10/21/20 170 lb (77.1 kg)  09/18/20 164 lb 12.8 oz (74.8 kg)     GEN:  Well nourished, well developed in no acute distress HEENT: Normal NECK: No JVD; No  carotid bruits LYMPHATICS: No lymphadenopathy CARDIAC: RRR, 3/6 mixed systolic and diastolic murmurs ( best heart at the apex), rubs, gallops RESPIRATORY:  Clear to auscultation without rales, wheezing or rhonchi  ABDOMEN: Soft, non-tender, non-distended MUSCULOSKELETAL:  No edema; No deformity  SKIN: Warm and dry NEUROLOGIC:  Alert and oriented x 3 PSYCHIATRIC:  Normal affect    Risk Assessment/Risk Calculators:     CARPREG II Risk Prediction Index Score:  1.  The patient's risk for a primary cardiac event is 5%.            ASSESSMENT & PLAN:   Murmur Shortness of breath  She does clinically have a mixed systolic and diastolic murmur on physical exam.  And with her shortness of breath it is beneficial to get an echocardiogram to make sure that there is no evidence of any valvular abnormalities.  This is important as it will help [place] with continue her cardiovascular care during her pregnancy as well as help determining if there is any need for delivery option planning.  I explained this to the patient.  All of her questions has been answered.  Patient Instructions  Medication Instructions:  Your physician recommends that you continue on your current medications as directed. Please refer to the Current Medication list given to you today.  *If you need a refill on your cardiac medications before your next appointment, please call your pharmacy*   Lab Work: None If you have labs (blood work) drawn today and your tests are completely normal, you will receive your results only by: MyChart Message (if you have MyChart) OR A paper copy in the mail If you have any lab test that is abnormal or we need to change your treatment, we will call you to review the results.   Testing/Procedures: Your physician has requested that you have an echocardiogram. Echocardiography is a painless test that uses sound waves to create images of your heart. It provides your doctor with information about  the size and shape of your heart and how well your heart's chambers and valves are working. This procedure takes approximately one hour. There are no restrictions for this procedure.    Follow-Up: At [facility], you and your health needs are our priority.  As part of our continuing mission to provide you with exceptional heart care, we have created designated Provider Care Teams.  These Care Teams include your primary Cardiologist (physician) and Advanced Practice Providers (APPs -  Physician Assistants and Nurse Practitioners) who all work together to provide you with the care you need, when you need it.  We recommend signing up for the patient portal called "MyChart".  Sign up information is provided on this After Visit Summary.  MyChart is used to connect with patients for Virtual Visits (Telemedicine).  Patients are able to view lab/test results, encounter notes, upcoming appointments, etc.  Non-urgent messages can be sent to your provider as well.   To learn more about what you can do with MyChart, go to [email or web address].    Your next appointment:   12 week(s)  The format for your next appointment:   In Person  Provider:   Thomasene Ripple, DO   Other Instructions Echocardiogram An echocardiogram is a test that uses sound waves (ultrasound) to produce images of the heart. Images from an echocardiogram can provide important information about: Heart size and shape. The size and thickness and movement of your heart's walls. Heart muscle function and strength. Heart valve function or if you have stenosis. Stenosis is when the heart valves are too narrow. If blood is flowing backward through the heart valves (regurgitation). A tumor or infectious growth around the heart valves. Areas of heart muscle that are not working well because of poor blood flow or injury from a heart attack. Aneurysm detection. An aneurysm is a weak or damaged part of an artery wall. The wall bulges out  from the normal force of blood pumping through  the body. Tell a health care provider about: Any allergies you have. All medicines you are taking, including vitamins, herbs, eye drops, creams, and over-the-counter medicines. Any blood disorders you have. Any surgeries you have had. Any medical conditions you have. Whether you are pregnant or may be pregnant. What are the risks? Generally, this is a safe test. However, problems may occur, including an allergic reaction to dye (contrast) that may be used during the test. What happens before the test? No specific preparation is needed. You may eat and drink normally. What happens during the test?  You will take off your clothes from the waist up and put on a hospital gown. Electrodes or electrocardiogram (ECG)patches may be placed on your chest. The electrodes or patches are then connected to a device that monitors your heart rate and rhythm. You will lie down on a table for an ultrasound exam. A gel will be applied to your chest to help sound waves pass through your skin. A handheld device, called a transducer, will be pressed against your chest and moved over your heart. The transducer produces sound waves that travel to your heart and bounce back (or "echo" back) to the transducer. These sound waves will be captured in real-time and changed into images of your heart that can be viewed on a video monitor. The images will be recorded on a computer and reviewed by your health care provider. You may be asked to change positions or hold your breath for a short time. This makes it easier to get different views or better views of your heart. In some cases, you may receive contrast through an IV in one of your veins. This can improve the quality of the pictures from your heart. The procedure may vary among health care providers and hospitals. What can I expect after the test? You may return to your normal, everyday life, including diet, activities,  andmedicines, unless your health care provider tells you not to do that. Follow these instructions at home: It is up to you to get the results of your test. Ask your health care provider, or the department that is doing the test, when your results will be ready. Keep all follow-up visits. This is important. Summary An echocardiogram is a test that uses sound waves (ultrasound) to produce images of the heart. Images from an echocardiogram can provide important information about the size and shape of your heart, heart muscle function, heart valve function, and other possible heart problems. You do not need to do anything to prepare before this test. You may eat and drink normally. After the echocardiogram is completed, you may return to your normal, everyday life, unless your health care provider tells you not to do that. This information is not intended to replace advice given to you by your health care provider. Make sure you discuss any questions you have with your healthcare provider. Document Revised: 10/23/2019 Document Reviewed: 10/23/2019 Elsevier Patient Education  2022 Elsevier Inc.    Dispo:  No follow-ups on file.   Medication Adjustments/Labs and Tests Ordered: Current medicines are reviewed at length with the patient today.  Concerns regarding medicines are outlined above.  Tests Ordered: Orders Placed This Encounter  Procedures   EKG 12-Lead   ECHOCARDIOGRAM COMPLETE   Medication Changes: No orders of the defined types were placed in this encounter.

## 2020-10-31 NOTE — Patient Instructions (Signed)
Medication Instructions:  Your physician recommends that you continue on your current medications as directed. Please refer to the Current Medication list given to you today.  *If you need a refill on your cardiac medications before your next appointment, please call your pharmacy*   Lab Work: None If you have labs (blood work) drawn today and your tests are completely normal, you will receive your results only by: MyChart Message (if you have MyChart) OR A paper copy in the mail If you have any lab test that is abnormal or we need to change your treatment, we will call you to review the results.   Testing/Procedures: Your physician has requested that you have an echocardiogram. Echocardiography is a painless test that uses sound waves to create images of your heart. It provides your doctor with information about the size and shape of your heart and how well your heart's chambers and valves are working. This procedure takes approximately one hour. There are no restrictions for this procedure.    Follow-Up: At [facility], you and your health needs are our priority.  As part of our continuing mission to provide you with exceptional heart care, we have created designated Provider Care Teams.  These Care Teams include your primary Cardiologist (physician) and Advanced Practice Providers (APPs -  Physician Assistants and Nurse Practitioners) who all work together to provide you with the care you need, when you need it.  We recommend signing up for the patient portal called "MyChart".  Sign up information is provided on this After Visit Summary.  MyChart is used to connect with patients for Virtual Visits (Telemedicine).  Patients are able to view lab/test results, encounter notes, upcoming appointments, etc.  Non-urgent messages can be sent to your provider as well.   To learn more about what you can do with MyChart, go to [email or web address].    Your next appointment:   12  week(s)  The format for your next appointment:   In Person  Provider:   Kardie Tobb, DO   Other Instructions Echocardiogram An echocardiogram is a test that uses sound waves (ultrasound) to produce images of the heart. Images from an echocardiogram can provide important information about: Heart size and shape. The size and thickness and movement of your heart's walls. Heart muscle function and strength. Heart valve function or if you have stenosis. Stenosis is when the heart valves are too narrow. If blood is flowing backward through the heart valves (regurgitation). A tumor or infectious growth around the heart valves. Areas of heart muscle that are not working well because of poor blood flow or injury from a heart attack. Aneurysm detection. An aneurysm is a weak or damaged part of an artery wall. The wall bulges out from the normal force of blood pumping through the body. Tell a health care provider about: Any allergies you have. All medicines you are taking, including vitamins, herbs, eye drops, creams, and over-the-counter medicines. Any blood disorders you have. Any surgeries you have had. Any medical conditions you have. Whether you are pregnant or may be pregnant. What are the risks? Generally, this is a safe test. However, problems may occur, including an allergic reaction to dye (contrast) that may be used during the test. What happens before the test? No specific preparation is needed. You may eat and drink normally. What happens during the test?  You will take off your clothes from the waist up and put on a hospital gown. Electrodes or electrocardiogram (ECG)patches may be placed on   your chest. The electrodes or patches are then connected to a device that monitors your heart rate and rhythm. You will lie down on a table for an ultrasound exam. A gel will be applied to your chest to help sound waves pass through your skin. A handheld device, called a transducer, will  be pressed against your chest and moved over your heart. The transducer produces sound waves that travel to your heart and bounce back (or "echo" back) to the transducer. These sound waves will be captured in real-time and changed into images of your heart that can be viewed on a video monitor. The images will be recorded on a computer and reviewed by your health care provider. You may be asked to change positions or hold your breath for a short time. This makes it easier to get different views or better views of your heart. In some cases, you may receive contrast through an IV in one of your veins. This can improve the quality of the pictures from your heart. The procedure may vary among health care providers and hospitals. What can I expect after the test? You may return to your normal, everyday life, including diet, activities, andmedicines, unless your health care provider tells you not to do that. Follow these instructions at home: It is up to you to get the results of your test. Ask your health care provider, or the department that is doing the test, when your results will be ready. Keep all follow-up visits. This is important. Summary An echocardiogram is a test that uses sound waves (ultrasound) to produce images of the heart. Images from an echocardiogram can provide important information about the size and shape of your heart, heart muscle function, heart valve function, and other possible heart problems. You do not need to do anything to prepare before this test. You may eat and drink normally. After the echocardiogram is completed, you may return to your normal, everyday life, unless your health care provider tells you not to do that. This information is not intended to replace advice given to you by your health care provider. Make sure you discuss any questions you have with your healthcare provider. Document Revised: 10/23/2019 Document Reviewed: 10/23/2019 Elsevier Patient Education   2022 Elsevier Inc.   

## 2020-11-01 NOTE — Progress Notes (Addendum)
Cardio-Obstetrics Clinic  New Evaluation  Date:  11/01/2020   ID:  Alison Mcbride, DOB 1995/11/27, MRN [id number]  PCP:  Pcp, No   [facility] Providers Cardiologist:  Thomasene Ripple, DO  Electrophysiologist:  None       Referring MD: Jerene Bears, MD   Chief Complaint: Heart murmur  History of Present Illness:    Alison Mcbride is a 25 y.o. female [G2P1001] currently at 18 weeks 4 days gestation who is being seen today for the evaluation of cardiac murmur at the request of Jerene Bears, MD.   Patient states she was referred because her OBGYN heard a murmur on exam. Patient has never been told she has a murmur previously. No family history of heart problems or cardiac murmurs to her knowledge. No complications during her prior pregnancy.  Patient does endorse shortness of breath with mild associated chest discomfort. Reports she's had these episodes several times per day for a few years. Describes the episodes as: "I'll be talking and suddenly need to catch my breath". Patient is still able to perform all her daily activities. She was an athlete throughout high school and did not notice these symptoms during that time.  No syncopal episodes, dizziness/lightheadedness, or other concerns.  Prior CV Studies Reviewed: The following studies were reviewed today: None  Past Medical History:  Diagnosis Date   Anxiety    Asthma    Seasonal allergies     Past Surgical History:  Procedure Laterality Date   ANKLE SURGERY     WISDOM TOOTH EXTRACTION        OB History     Gravida  2   Para  1   Term  1   Preterm      AB      Living  1      SAB      IAB      Ectopic      Multiple  0   Live Births  1               Current Medications: Current Meds  Medication Sig   albuterol (PROVENTIL HFA;VENTOLIN HFA) 108 (90 Base) MCG/ACT inhaler Inhale 1-2 puffs into the lungs every 6 (six) hours as needed for wheezing or shortness of breath.   Multiple  Vitamins-Minerals (WOMENS MULTIVITAMIN PO) Take by mouth.     Allergies:   Patient has no known allergies.   Social History   Socioeconomic History   Marital status: Single    Spouse name: Not on file   Number of children: Not on file   Years of education: Not on file   Highest education level: Not on file  Occupational History   Not on file  Tobacco Use   Smoking status: Never   Smokeless tobacco: Never   Tobacco comments:    Smokes marijuana  Vaping Use   Vaping Use: Former  Substance and Sexual Activity   Alcohol use: Not Currently    Comment: occasional   Drug use: Yes    Types: Marijuana   Sexual activity: Yes    Birth control/protection: None  Other Topics Concern   Not on file  Social History Narrative   Not on file   Social Determinants of Health   Financial Resource Strain: High Risk   Difficulty of Paying Living Expenses: Hard  Food Insecurity: No Food Insecurity   Worried About Running Out of Food in the Last Year: Never true   [employer] of Food  in the Last Year: Never true  Transportation Needs: No Transportation Needs   Lack of Transportation (Medical): No   Lack of Transportation (Non-Medical): No  Physical Activity: Sufficiently Active   Days of Exercise per Week: 5 days   Minutes of Exercise per Session: 100 min  Stress: Stress Concern Present   Feeling of Stress : To some extent  Social Connections: Unknown   Frequency of Communication with Friends and Family: More than three times a week   Frequency of Social Gatherings with Friends and Family: Twice a week   Attends Religious Services: 1 to 4 times per year   Active Member of [employer] or Organizations: No   Attends Engineer, structural: Never   Marital Status: Not on file      Family History  Problem Relation Age of Onset   Hypertension Mother    Diabetes Mother    Hypertension Father    Asthma Son    Lupus Maternal Aunt    Cancer Maternal Grandmother    Cancer Maternal  Grandfather        liver      ROS:   Please see the history of present illness.    All other systems reviewed and are negative.   Labs/EKG Reviewed:    EKG:   EKG is ordered today.  The ekg ordered today demonstrates sinus arrhythmia, otherwise normal  Recent Labs: 08/21/2020: Hemoglobin 12.0; Platelets 333   Recent Lipid Panel No results found for: CHOL, TRIG, HDL, CHOLHDL, LDLCALC, LDLDIRECT  Physical Exam:    VS:  BP 119/72 (BP Location: Right Arm, Patient Position: Sitting, Cuff Size: Normal)   Pulse 74   Ht 5\' 4"  (1.626 m)   Wt 178 lb (80.7 kg)   LMP 06/23/2020   SpO2 100%   BMI 30.55 kg/m     Wt Readings from Last 3 Encounters:  10/31/20 178 lb (80.7 kg)  10/21/20 170 lb (77.1 kg)  09/18/20 164 lb 12.8 oz (74.8 kg)     GEN: Well nourished, well developed in no acute distress HEENT: Normal NECK: No JVD; No carotid bruits LYMPHATICS: No lymphadenopathy CARDIAC: RRR, II/VI late systolic to diastolic murmur heard diffusely, increased in L lateral decubitus and radiating to mid axillary line. RESPIRATORY:  Clear to auscultation without rales, wheezing or rhonchi  ABDOMEN: Soft, non-tender, non-distended MUSCULOSKELETAL:  No edema; No deformity  SKIN: Warm and dry NEUROLOGIC:  Alert and oriented x 3 PSYCHIATRIC:  Normal affect    Risk Assessment/Risk Calculators:     CARPREG II Risk Prediction Index Score:  1.  The patient's risk for a primary cardiac event is 5%.           ASSESSMENT & PLAN:    Diastolic murmur Shortness of breath  Diastolic murmur noted on patient's exam today, concerning for mitral valve pathology. She also endorses shortness of breath over the past few years. Will obtain echocardiogram for further evaluation. Patient to follow up in 12 weeks, or sooner if needed pending echo results.  Patient Instructions  Medication Instructions:  Your physician recommends that you continue on your current medications as directed. Please refer  to the Current Medication list given to you today.  *If you need a refill on your cardiac medications before your next appointment, please call your pharmacy*   Lab Work: None If you have labs (blood work) drawn today and your tests are completely normal, you will receive your results only by: MyChart Message (if you have MyChart) OR  A paper copy in the mail If you have any lab test that is abnormal or we need to change your treatment, we will call you to review the results.   Testing/Procedures: Your physician has requested that you have an echocardiogram. Echocardiography is a painless test that uses sound waves to create images of your heart. It provides your doctor with information about the size and shape of your heart and how well your heart's chambers and valves are working. This procedure takes approximately one hour. There are no restrictions for this procedure.    Follow-Up: At [facility], you and your health needs are our priority.  As part of our continuing mission to provide you with exceptional heart care, we have created designated Provider Care Teams.  These Care Teams include your primary Cardiologist (physician) and Advanced Practice Providers (APPs -  Physician Assistants and Nurse Practitioners) who all work together to provide you with the care you need, when you need it.  We recommend signing up for the patient portal called "MyChart".  Sign up information is provided on this After Visit Summary.  MyChart is used to connect with patients for Virtual Visits (Telemedicine).  Patients are able to view lab/test results, encounter notes, upcoming appointments, etc.  Non-urgent messages can be sent to your provider as well.   To learn more about what you can do with MyChart, go to [email or web address].    Your next appointment:   12 week(s)  The format for your next appointment:   In Person  Provider:   Thomasene Ripple, DO   Other Instructions Echocardiogram An  echocardiogram is a test that uses sound waves (ultrasound) to produce images of the heart. Images from an echocardiogram can provide important information about: Heart size and shape. The size and thickness and movement of your heart's walls. Heart muscle function and strength. Heart valve function or if you have stenosis. Stenosis is when the heart valves are too narrow. If blood is flowing backward through the heart valves (regurgitation). A tumor or infectious growth around the heart valves. Areas of heart muscle that are not working well because of poor blood flow or injury from a heart attack. Aneurysm detection. An aneurysm is a weak or damaged part of an artery wall. The wall bulges out from the normal force of blood pumping through the body. Tell a health care provider about: Any allergies you have. All medicines you are taking, including vitamins, herbs, eye drops, creams, and over-the-counter medicines. Any blood disorders you have. Any surgeries you have had. Any medical conditions you have. Whether you are pregnant or may be pregnant. What are the risks? Generally, this is a safe test. However, problems may occur, including an allergic reaction to dye (contrast) that may be used during the test. What happens before the test? No specific preparation is needed. You may eat and drink normally. What happens during the test?  You will take off your clothes from the waist up and put on a hospital gown. Electrodes or electrocardiogram (ECG)patches may be placed on your chest. The electrodes or patches are then connected to a device that monitors your heart rate and rhythm. You will lie down on a table for an ultrasound exam. A gel will be applied to your chest to help sound waves pass through your skin. A handheld device, called a transducer, will be pressed against your chest and moved over your heart. The transducer produces sound waves that travel to your heart and bounce back (or  "  echo" back) to the transducer. These sound waves will be captured in real-time and changed into images of your heart that can be viewed on a video monitor. The images will be recorded on a computer and reviewed by your health care provider. You may be asked to change positions or hold your breath for a short time. This makes it easier to get different views or better views of your heart. In some cases, you may receive contrast through an IV in one of your veins. This can improve the quality of the pictures from your heart. The procedure may vary among health care providers and hospitals. What can I expect after the test? You may return to your normal, everyday life, including diet, activities, andmedicines, unless your health care provider tells you not to do that. Follow these instructions at home: It is up to you to get the results of your test. Ask your health care provider, or the department that is doing the test, when your results will be ready. Keep all follow-up visits. This is important. Summary An echocardiogram is a test that uses sound waves (ultrasound) to produce images of the heart. Images from an echocardiogram can provide important information about the size and shape of your heart, heart muscle function, heart valve function, and other possible heart problems. You do not need to do anything to prepare before this test. You may eat and drink normally. After the echocardiogram is completed, you may return to your normal, everyday life, unless your health care provider tells you not to do that. This information is not intended to replace advice given to you by your health care provider. Make sure you discuss any questions you have with your healthcare provider. Document Revised: 10/23/2019 Document Reviewed: 10/23/2019 Elsevier Patient Education  2022 Elsevier Inc.    Dispo:  No follow-ups on file.   Medication Adjustments/Labs and Tests Ordered: Current medicines are reviewed  at length with the patient today.  Concerns regarding medicines are outlined above.  Tests Ordered: Orders Placed This Encounter  Procedures   EKG 12-Lead   ECHOCARDIOGRAM COMPLETE   Medication Changes: No orders of the defined types were placed in this encounter.  Maury DusAshleigh Nickol Collister, MD PGY-2 [facility]

## 2020-11-03 ENCOUNTER — Other Ambulatory Visit: Payer: Self-pay

## 2020-11-03 ENCOUNTER — Other Ambulatory Visit (HOSPITAL_BASED_OUTPATIENT_CLINIC_OR_DEPARTMENT_OTHER): Payer: Self-pay | Admitting: Obstetrics & Gynecology

## 2020-11-03 ENCOUNTER — Ambulatory Visit: Payer: Medicaid Other | Attending: Obstetrics & Gynecology

## 2020-11-03 ENCOUNTER — Other Ambulatory Visit: Payer: Self-pay | Admitting: *Deleted

## 2020-11-03 DIAGNOSIS — Z3481 Encounter for supervision of other normal pregnancy, first trimester: Secondary | ICD-10-CM

## 2020-11-03 DIAGNOSIS — O4442 Low lying placenta NOS or without hemorrhage, second trimester: Secondary | ICD-10-CM

## 2020-11-04 ENCOUNTER — Ambulatory Visit (HOSPITAL_COMMUNITY): Payer: Medicaid Other | Attending: Cardiology

## 2020-11-04 DIAGNOSIS — I5031 Acute diastolic (congestive) heart failure: Secondary | ICD-10-CM | POA: Insufficient documentation

## 2020-11-04 LAB — ECHOCARDIOGRAM COMPLETE
Area-P 1/2: 3.37 cm2
S' Lateral: 2.5 cm

## 2020-11-04 NOTE — Telephone Encounter (Signed)
Patient has been seen by cardiology on 10/31/2020. She is also scheduled for echo today 11/04/2020. She will be following up with our office 11/20/2020. tbw

## 2020-11-20 ENCOUNTER — Ambulatory Visit (INDEPENDENT_AMBULATORY_CARE_PROVIDER_SITE_OTHER): Payer: Medicaid Other | Admitting: Obstetrics & Gynecology

## 2020-11-20 ENCOUNTER — Other Ambulatory Visit: Payer: Self-pay

## 2020-11-20 ENCOUNTER — Encounter (HOSPITAL_BASED_OUTPATIENT_CLINIC_OR_DEPARTMENT_OTHER): Payer: Self-pay | Admitting: Obstetrics & Gynecology

## 2020-11-20 VITALS — BP 137/76 | HR 84 | Wt 181.0 lb

## 2020-11-20 DIAGNOSIS — Z3A21 21 weeks gestation of pregnancy: Secondary | ICD-10-CM

## 2020-11-20 DIAGNOSIS — Z3481 Encounter for supervision of other normal pregnancy, first trimester: Secondary | ICD-10-CM

## 2020-11-20 DIAGNOSIS — O4440 Low lying placenta NOS or without hemorrhage, unspecified trimester: Secondary | ICD-10-CM

## 2020-11-20 DIAGNOSIS — Z1331 Encounter for screening for depression: Secondary | ICD-10-CM

## 2020-11-20 DIAGNOSIS — J4520 Mild intermittent asthma, uncomplicated: Secondary | ICD-10-CM

## 2020-11-20 DIAGNOSIS — R011 Cardiac murmur, unspecified: Secondary | ICD-10-CM

## 2020-11-20 NOTE — Progress Notes (Signed)
   PRENATAL VISIT NOTE  Subjective:  Alison Mcbride is a 25 y.o. G2P1001 at [redacted]w[redacted]d being seen today for ongoing prenatal care.  She is currently monitored for the following issues for this low-risk pregnancy and has Supervision of normal intrauterine pregnancy in multigravida; History of depression; Marijuana use; Positive depression screening; Anxiety; Asthma; and Seasonal allergies on their problem list.  Patient reports no complaints.  Contractions: Not present. Vag. Bleeding: None.  Movement: Absent. Denies leaking of fluid.   The following portions of the patient's history were reviewed and updated as appropriate: allergies, current medications, past family history, past medical history, past social history, past surgical history and problem list.   Objective:   Vitals:   11/20/20 1149  BP: 137/76  Pulse: 84  Weight: 181 lb (82.1 kg)    Fetal Status: Fetal Heart Rate (bpm): 142   Movement: Absent     General:  Alert, oriented and cooperative. Patient is in no acute distress.  Skin: Skin is warm and dry. No rash noted.   Cardiovascular: Normal heart rate noted  Respiratory: Normal respiratory effort, no problems with respiration noted  Abdomen: Soft, gravid, appropriate for gestational age.  Pain/Pressure: Present     Pelvic: Cervical exam deferred        Extremities: Normal range of motion.  Edema: None  Mental Status: Normal mood and affect. Normal behavior. Normal judgment and thought content.   Assessment and Plan:  Pregnancy: G2P1001 at [redacted]w[redacted]d 1. [redacted] weeks gestation of pregnancy - on PNV - AFP, Serum, Open Spina Bifida - considering water birth, information given including must completed class, bring certificate and have appt with CNM - prenatal classes discussed  2. Heart murmur - did see Dr. Servando Salina, echo showed mild tricuspid regurgitation, no additional testing recommended  3. Supervision of normal intrauterine pregnancy in multigravida in first trimester  4.  Positive depression screening - referral to behavioral health placed.  Pt has not returned calls.  5. Mild intermittent asthma without complication  6.  Low lying placenta - MFM follow up is scheduled  Preterm labor symptoms and general obstetric precautions including but not limited to vaginal bleeding, contractions, leaking of fluid and fetal movement were reviewed in detail with the patient. Please refer to After Visit Summary for other counseling recommendations.   Return in about 4 weeks (around 12/18/2020).  Future Appointments  Date Time Provider Department Center  11/26/2020  9:15 AM [facility] HEALTH CLINICIAN [facility]  12/15/2020  2:45 PM [facility] NURSE [facility]  12/15/2020  3:00 PM [facility] US1 [facility]  12/17/2020 11:15 AM Jerene Bears, MD [facility]  01/14/2021  9:45 AM Jerene Bears, MD [facility]  01/23/2021  4:00 PM Thomasene Ripple, DO [facility] None  02/09/2021  8:30 AM Jerene Bears, MD [facility]  02/26/2021  9:15 AM Jerene Bears, MD [facility]  03/05/2021 10:30 AM Jerene Bears, MD [facility]  03/17/2021  8:45 AM Jerene Bears, MD [facility]  03/23/2021  8:30 AM Jerene Bears, MD [facility]  03/30/2021  8:45 AM Jerene Bears, MD [facility]    Jerene Bears, MD

## 2020-11-22 LAB — AFP, SERUM, OPEN SPINA BIFIDA
AFP MoM: 0.87
AFP Value: 59.8 ng/mL
Gest. Age on Collection Date: 21.3 weeks
Maternal Age At EDD: 25.7 yr
OSBR Risk 1 IN: 10000
Test Results:: NEGATIVE
Weight: 181 [lb_av]

## 2020-11-23 DIAGNOSIS — R011 Cardiac murmur, unspecified: Secondary | ICD-10-CM | POA: Insufficient documentation

## 2020-11-23 DIAGNOSIS — O4440 Low lying placenta NOS or without hemorrhage, unspecified trimester: Secondary | ICD-10-CM | POA: Insufficient documentation

## 2020-11-24 NOTE — BH Specialist Note (Deleted)
Pt did not arrive to video visit and did not answer the phone; Left HIPPA-compliant message to call back Bronte Kropf from [facility] at  [phone] (Joeanna Howdyshell's office).  ?; left MyChart message for patient.  ? ?

## 2020-12-03 ENCOUNTER — Encounter (HOSPITAL_BASED_OUTPATIENT_CLINIC_OR_DEPARTMENT_OTHER): Payer: Self-pay

## 2020-12-15 ENCOUNTER — Encounter: Payer: Self-pay | Admitting: *Deleted

## 2020-12-15 ENCOUNTER — Ambulatory Visit: Payer: Medicaid Other | Attending: Obstetrics

## 2020-12-15 ENCOUNTER — Other Ambulatory Visit: Payer: Self-pay

## 2020-12-15 ENCOUNTER — Ambulatory Visit: Payer: Medicaid Other | Admitting: *Deleted

## 2020-12-15 VITALS — BP 120/63 | HR 79

## 2020-12-15 DIAGNOSIS — O99412 Diseases of the circulatory system complicating pregnancy, second trimester: Secondary | ICD-10-CM

## 2020-12-15 DIAGNOSIS — Z3482 Encounter for supervision of other normal pregnancy, second trimester: Secondary | ICD-10-CM

## 2020-12-15 DIAGNOSIS — I499 Cardiac arrhythmia, unspecified: Secondary | ICD-10-CM | POA: Diagnosis not present

## 2020-12-15 DIAGNOSIS — O4442 Low lying placenta NOS or without hemorrhage, second trimester: Secondary | ICD-10-CM | POA: Diagnosis present

## 2020-12-15 DIAGNOSIS — Z3A25 25 weeks gestation of pregnancy: Secondary | ICD-10-CM

## 2020-12-15 DIAGNOSIS — Z0372 Encounter for suspected placental problem ruled out: Secondary | ICD-10-CM

## 2020-12-15 DIAGNOSIS — O99512 Diseases of the respiratory system complicating pregnancy, second trimester: Secondary | ICD-10-CM

## 2020-12-15 DIAGNOSIS — J45909 Unspecified asthma, uncomplicated: Secondary | ICD-10-CM

## 2020-12-16 ENCOUNTER — Encounter (HOSPITAL_BASED_OUTPATIENT_CLINIC_OR_DEPARTMENT_OTHER): Payer: Self-pay | Admitting: Obstetrics & Gynecology

## 2020-12-17 ENCOUNTER — Encounter (HOSPITAL_BASED_OUTPATIENT_CLINIC_OR_DEPARTMENT_OTHER): Payer: Self-pay | Admitting: Obstetrics & Gynecology

## 2020-12-17 ENCOUNTER — Other Ambulatory Visit: Payer: Self-pay

## 2020-12-17 ENCOUNTER — Ambulatory Visit (INDEPENDENT_AMBULATORY_CARE_PROVIDER_SITE_OTHER): Payer: Medicaid Other | Admitting: Obstetrics & Gynecology

## 2020-12-17 ENCOUNTER — Telehealth: Payer: Self-pay | Admitting: Cardiology

## 2020-12-17 VITALS — BP 119/79 | HR 89 | Wt 184.6 lb

## 2020-12-17 DIAGNOSIS — Z1331 Encounter for screening for depression: Secondary | ICD-10-CM

## 2020-12-17 DIAGNOSIS — Z3A25 25 weeks gestation of pregnancy: Secondary | ICD-10-CM

## 2020-12-17 DIAGNOSIS — Z3482 Encounter for supervision of other normal pregnancy, second trimester: Secondary | ICD-10-CM

## 2020-12-17 DIAGNOSIS — K625 Hemorrhage of anus and rectum: Secondary | ICD-10-CM

## 2020-12-17 DIAGNOSIS — O4440 Low lying placenta NOS or without hemorrhage, unspecified trimester: Secondary | ICD-10-CM

## 2020-12-17 MED ORDER — HYDROCORTISONE (PERIANAL) 2.5 % EX CREA: TOPICAL_CREAM | Freq: Two times a day (BID) | CUTANEOUS | 1 refills | Status: DC

## 2020-12-17 NOTE — Patient Instructions (Signed)
Colace 100mg  take twice daily No laxatives

## 2020-12-17 NOTE — Progress Notes (Signed)
   PRENATAL VISIT NOTE  Subjective:  Alison Mcbride is a 25 y.o. G2P1001 at [redacted]w[redacted]d being seen today for ongoing prenatal care.  She is currently monitored for the following issues for this low-risk pregnancy and has Supervision of normal intrauterine pregnancy in multigravida; History of depression; Marijuana use; Positive depression screening; Anxiety; Asthma; Seasonal allergies; Low-lying placenta; and Heart murmur on their problem list.  Patient reports no complaints.  Contractions: Irritability Denies leaking of fluid.   H/o low lying placenta.  Follow up u/s 12/15/2020 showed normal growth, nl fluid and resolution of low lying placenta.  No additional follow up recommended.    Has signed up for waterbirth on November 14th.  Aware will need appt with CNM after class has been completed.    The following portions of the patient's history were reviewed and updated as appropriate: allergies, current medications, past family history, past medical history, past social history, past surgical history and problem list.   Objective:   Vitals:   12/17/20 1116  BP: 119/79  Pulse: 89  Weight: 184 lb 9.6 oz (83.7 kg)    Fetal Status: Fetal Heart Rate (bpm): 138   Movement: Present     General:  Alert, oriented and cooperative. Patient is in no acute distress.  Skin: Skin is warm and dry. No rash noted.   Cardiovascular: Normal heart rate noted  Respiratory: Normal respiratory effort, no problems with respiration noted  Abdomen: Soft, gravid, appropriate for gestational age.  Pain/Pressure: Present     Pelvic: Cervical exam deferred        Extremities: Normal range of motion.  Edema: None  Mental Status: Normal mood and affect. Normal behavior. Normal judgment and thought content.   Assessment and Plan:  Pregnancy: G2P1001 at [redacted]w[redacted]d 1. [redacted] weeks gestation of pregnancy - on PNV - recheck 4 weeks.  Plan GTT, 2nd trimester lab work and Tdap  2. Supervision of normal intrauterine pregnancy in  multigravida in second trimester  3. Rectal bleeding - hydrocortisone (ANUSOL-HC) 2.5 % rectal cream; Place rectally 2 (two) times daily.  Dispense: 30 g; Refill: 1  4. Low-lying placenta - has resolved.  No follow up recommended.   Return in about 4 weeks (around 01/14/2021).  Future Appointments  Date Time Provider Department Center  01/14/2021  9:45 AM Jerene Bears, MD [facility]  01/23/2021  4:00 PM Thomasene Ripple, DO [facility] None  02/09/2021  8:30 AM Jerene Bears, MD [facility]  02/26/2021  9:15 AM Jerene Bears, MD [facility]  03/05/2021 10:30 AM Jerene Bears, MD [facility]  03/17/2021  8:45 AM Jerene Bears, MD [facility]  03/23/2021  8:30 AM Jerene Bears, MD [facility]  03/30/2021  8:45 AM Jerene Bears, MD [facility]    Jerene Bears, MD

## 2020-12-17 NOTE — Telephone Encounter (Signed)
  Patient would like to know if she needs to keep her 01/23/21 appointment since her tests came back good. Please advise.

## 2020-12-17 NOTE — Telephone Encounter (Signed)
Called pt to let her know she should keep her appointment on 11/11. No answer at this time, left message with information.

## 2021-01-02 ENCOUNTER — Encounter (HOSPITAL_BASED_OUTPATIENT_CLINIC_OR_DEPARTMENT_OTHER): Payer: Self-pay

## 2021-01-13 ENCOUNTER — Encounter (HOSPITAL_BASED_OUTPATIENT_CLINIC_OR_DEPARTMENT_OTHER): Payer: Self-pay | Admitting: Obstetrics & Gynecology

## 2021-01-14 ENCOUNTER — Encounter (HOSPITAL_BASED_OUTPATIENT_CLINIC_OR_DEPARTMENT_OTHER): Payer: Self-pay | Admitting: Obstetrics & Gynecology

## 2021-01-14 ENCOUNTER — Ambulatory Visit (INDEPENDENT_AMBULATORY_CARE_PROVIDER_SITE_OTHER): Payer: Medicaid Other | Admitting: Obstetrics & Gynecology

## 2021-01-14 ENCOUNTER — Other Ambulatory Visit: Payer: Self-pay

## 2021-01-14 VITALS — BP 135/82 | HR 88 | Wt 189.6 lb

## 2021-01-14 DIAGNOSIS — Z131 Encounter for screening for diabetes mellitus: Secondary | ICD-10-CM

## 2021-01-14 DIAGNOSIS — Z1331 Encounter for screening for depression: Secondary | ICD-10-CM

## 2021-01-14 DIAGNOSIS — Z23 Encounter for immunization: Secondary | ICD-10-CM | POA: Diagnosis not present

## 2021-01-14 DIAGNOSIS — Z3A29 29 weeks gestation of pregnancy: Secondary | ICD-10-CM

## 2021-01-14 DIAGNOSIS — Z3483 Encounter for supervision of other normal pregnancy, third trimester: Secondary | ICD-10-CM

## 2021-01-14 DIAGNOSIS — Z3482 Encounter for supervision of other normal pregnancy, second trimester: Secondary | ICD-10-CM

## 2021-01-14 NOTE — Progress Notes (Addendum)
   PRENATAL VISIT NOTE  Subjective:  Alison Mcbride is a 25 y.o. G2P1001 at [redacted]w[redacted]d being seen today for ongoing prenatal care.  She is currently monitored for the following issues for this low-risk pregnancy and has Supervision of normal intrauterine pregnancy in multigravida; History of depression; Marijuana use; Positive depression screening; Anxiety; Asthma; Seasonal allergies; Low-lying placenta; and Heart murmur on their problem list.  Planning/hoping for water birth.  Has class scheduled in November.  Knows to let me know when this is completed.  Patient reports no complaints.  Contractions: Irregular. Vag. Bleeding: None.  Movement: Present. Denies leaking of fluid.   Depression screen today was positive at 14.  Anxiety screen was positive as well 15.  D/w pt.  She does not want treatment at this time.  Does not desire referral to therapist either.  Discussed some causes.  She will monitor.  The following portions of the patient's history were reviewed and updated as appropriate: allergies, current medications, past family history, past medical history, past social history, past surgical history and problem list.   Objective:   Vitals:   01/14/21 1027  BP: 135/82  Pulse: 88  Weight: 189 lb 9.6 oz (86 kg)    Fetal Status: Fetal Heart Rate (bpm): 141 Fundal Height: 29 cm Movement: Present     General:  Alert, oriented and cooperative. Patient is in no acute distress.  Skin: Skin is warm and dry. No rash noted.   Cardiovascular: Normal heart rate noted  Respiratory: Normal respiratory effort, no problems with respiration noted  Abdomen: Soft, gravid, appropriate for gestational age.  Pain/Pressure: Present     Pelvic: Cervical exam deferred        Extremities: Normal range of motion.  Edema: None  Mental Status: Normal mood and affect. Normal behavior. Normal judgment and thought content.   Assessment and Plan:  Pregnancy: G2P1001 at [redacted]w[redacted]d 1. Screening for diabetes mellitus -  Glucose Tolerance, 2 Hours w/1 Hour  2. [redacted] weeks gestation of pregnancy - on PNV - Antibody screen - CBC - Glucose Tolerance, 2 Hours w/1 Hour - HIV Antibody (routine testing w rflx) - RPR - updated Tdap today  3. Encounter for supervision of other normal pregnancy in third trimester  4.  Positive depression screen - discussed with pt.  She is going to monitor symptoms.  Declines medication or referral today.  Aware to call if changes mind.   Preterm labor symptoms and general obstetric precautions including but not limited to vaginal bleeding, contractions, leaking of fluid and fetal movement were reviewed in detail with the patient. Please refer to After Visit Summary for other counseling recommendations.   Return in about 4 weeks (around 02/11/2021).  Future Appointments  Date Time Provider Department Center  01/23/2021  4:00 PM Thomasene Ripple, DO [facility] None  02/09/2021  8:30 AM Jerene Bears, MD [facility]  02/26/2021  9:15 AM Jerene Bears, MD [facility]  03/05/2021 10:30 AM Jerene Bears, MD [facility]  03/17/2021  8:45 AM Jerene Bears, MD [facility]  03/23/2021  8:30 AM Jerene Bears, MD [facility]  03/30/2021  8:45 AM Jerene Bears, MD [facility]    Jerene Bears, MD

## 2021-01-15 LAB — CBC
Hematocrit: 30.8 % — ABNORMAL LOW (ref 34.0–46.6)
Hemoglobin: 10.2 g/dL — ABNORMAL LOW (ref 11.1–15.9)
MCH: 28.6 pg (ref 26.6–33.0)
MCHC: 33.1 g/dL (ref 31.5–35.7)
MCV: 86 fL (ref 79–97)
Platelets: 273 10*3/uL (ref 150–450)
RBC: 3.57 x10E6/uL — ABNORMAL LOW (ref 3.77–5.28)
RDW: 12.7 % (ref 11.7–15.4)
WBC: 5.7 10*3/uL (ref 3.4–10.8)

## 2021-01-15 LAB — GLUCOSE TOLERANCE, 2 HOURS W/ 1HR
Glucose, 1 hour: 133 mg/dL (ref 70–179)
Glucose, 2 hour: 90 mg/dL (ref 70–152)
Glucose, Fasting: 80 mg/dL (ref 70–91)

## 2021-01-15 LAB — RPR: RPR Ser Ql: NONREACTIVE

## 2021-01-15 LAB — HIV ANTIBODY (ROUTINE TESTING W REFLEX): HIV Screen 4th Generation wRfx: NONREACTIVE

## 2021-01-15 LAB — ANTIBODY SCREEN: Antibody Screen: NEGATIVE

## 2021-01-23 ENCOUNTER — Ambulatory Visit: Payer: Medicaid Other | Admitting: Cardiology

## 2021-01-26 ENCOUNTER — Other Ambulatory Visit: Payer: Self-pay

## 2021-01-26 ENCOUNTER — Other Ambulatory Visit (HOSPITAL_COMMUNITY)
Admission: RE | Admit: 2021-01-26 | Discharge: 2021-01-26 | Disposition: A | Discharge status: Home / Self Care | Payer: Medicaid Other | Source: Ambulatory Visit | Attending: Obstetrics & Gynecology | Admitting: Obstetrics & Gynecology

## 2021-01-26 ENCOUNTER — Encounter (HOSPITAL_BASED_OUTPATIENT_CLINIC_OR_DEPARTMENT_OTHER): Payer: Self-pay

## 2021-01-26 ENCOUNTER — Ambulatory Visit (INDEPENDENT_AMBULATORY_CARE_PROVIDER_SITE_OTHER): Payer: Medicaid Other | Admitting: Obstetrics & Gynecology

## 2021-01-26 VITALS — BP 121/80 | HR 85 | Wt 192.8 lb

## 2021-01-26 DIAGNOSIS — N898 Other specified noninflammatory disorders of vagina: Secondary | ICD-10-CM | POA: Diagnosis present

## 2021-01-26 DIAGNOSIS — Z8659 Personal history of other mental and behavioral disorders: Secondary | ICD-10-CM

## 2021-01-26 DIAGNOSIS — R011 Cardiac murmur, unspecified: Secondary | ICD-10-CM

## 2021-01-26 DIAGNOSIS — Z3483 Encounter for supervision of other normal pregnancy, third trimester: Secondary | ICD-10-CM

## 2021-01-26 DIAGNOSIS — Z3A31 31 weeks gestation of pregnancy: Secondary | ICD-10-CM

## 2021-01-26 NOTE — Progress Notes (Signed)
   PRENATAL VISIT NOTE  Subjective:  Alison Mcbride is a 25 y.o. G2P1001 at [redacted]w[redacted]d being seen today for ongoing prenatal care.  She is currently monitored for the following issues for this low-risk pregnancy and has Supervision of normal intrauterine pregnancy in multigravida; History of depression; Marijuana use; Positive depression screening; Anxiety; Asthma; Seasonal allergies; Low-lying placenta; and Heart murmur on their problem list.  Patient reports  complaint of vaginal discharge that started yesterday.   There is some odor with it.  Denies gush of fluid or vaginal bleeding.  Having good fetal movement and occasional contractions. FCA:  135 BPM.  The following portions of the patient's history were reviewed and updated as appropriate: allergies, current medications, past family history, past medical history, past social history, past surgical history and problem list.   Objective:   Vitals:   01/26/21 1325  BP: 121/80  Pulse: 85  Weight: 192 lb 12.8 oz (87.5 kg)    Fetal Status: Fetal Heart Rate (bpm): 135 Fundal Height: 31 cm Movement: Present     General:  Alert, oriented and cooperative. Patient is in no acute distress.  Skin: Skin is warm and dry. No rash noted.   Cardiovascular: Normal heart rate noted  Respiratory: Normal respiratory effort, no problems with respiration noted  Abdomen: Soft, gravid, appropriate for gestational age.  Pain/Pressure: Present     Pelvic: Cervical exam deferred        Extremities: Normal range of motion.  Edema: None  Mental Status: Normal mood and affect. Normal behavior. Normal judgment and thought content.   Assessment and Plan:  Pregnancy: G2P1001 at [redacted]w[redacted]d 1. [redacted] weeks gestation of pregnancy - on PNV  2. Supervision of normal intrauterine pregnancy in multigravida in third trimester  3. Vaginal discharge - Cervicovaginal ancillary only( [place])  4. Vaginal odor - Cervicovaginal ancillary only( [place])  5. Heart  murmur - has seen cardiology, Dr. Servando Salina  6. History of depression   Preterm labor symptoms and general obstetric precautions including but not limited to vaginal bleeding, contractions, leaking of fluid and fetal movement were reviewed in detail with the patient. Please refer to After Visit Summary for other counseling recommendations.   Return in about 2 weeks (around 02/09/2021).  Future Appointments  Date Time Provider Department Center  01/26/2021  2:35 PM Jerene Bears, MD [facility]  01/27/2021  8:40 AM Thomasene Ripple, DO [facility]  02/09/2021  8:30 AM Jerene Bears, MD [facility]  02/26/2021  9:15 AM Jerene Bears, MD [facility]  03/05/2021 10:30 AM Jerene Bears, MD [facility]  03/17/2021  8:45 AM Jerene Bears, MD [facility]  03/23/2021  8:30 AM Jerene Bears, MD [facility]  03/30/2021  8:45 AM Jerene Bears, MD [facility]    Jerene Bears, MD

## 2021-01-27 ENCOUNTER — Encounter (HOSPITAL_BASED_OUTPATIENT_CLINIC_OR_DEPARTMENT_OTHER): Payer: Self-pay

## 2021-01-27 ENCOUNTER — Ambulatory Visit: Payer: Medicaid Other | Admitting: Cardiology

## 2021-01-27 LAB — CERVICOVAGINAL ANCILLARY ONLY
Bacterial Vaginitis (gardnerella): POSITIVE — AB
Candida Glabrata: NEGATIVE
Candida Vaginitis: POSITIVE — AB
Chlamydia: NEGATIVE
Comment: NEGATIVE
Comment: NEGATIVE
Comment: NEGATIVE
Comment: NEGATIVE
Comment: NEGATIVE
Comment: NORMAL
Neisseria Gonorrhea: NEGATIVE
Trichomonas: NEGATIVE

## 2021-01-28 ENCOUNTER — Other Ambulatory Visit (HOSPITAL_BASED_OUTPATIENT_CLINIC_OR_DEPARTMENT_OTHER): Payer: Self-pay | Admitting: *Deleted

## 2021-01-28 ENCOUNTER — Encounter (HOSPITAL_BASED_OUTPATIENT_CLINIC_OR_DEPARTMENT_OTHER): Payer: Self-pay | Admitting: *Deleted

## 2021-01-28 MED ORDER — TERCONAZOLE 0.4 % VA CREA: 1.0000 | TOPICAL_CREAM | Freq: Every day | VAGINAL | 0 refills | Status: DC

## 2021-01-28 MED ORDER — METRONIDAZOLE 500 MG PO TABS: 500.0000 mg | ORAL_TABLET | Freq: Two times a day (BID) | ORAL | 0 refills | Status: DC

## 2021-01-29 ENCOUNTER — Encounter (HOSPITAL_BASED_OUTPATIENT_CLINIC_OR_DEPARTMENT_OTHER): Payer: Self-pay | Admitting: *Deleted

## 2021-02-09 ENCOUNTER — Ambulatory Visit (INDEPENDENT_AMBULATORY_CARE_PROVIDER_SITE_OTHER): Payer: Medicaid Other | Admitting: Obstetrics & Gynecology

## 2021-02-09 ENCOUNTER — Other Ambulatory Visit: Payer: Self-pay

## 2021-02-09 VITALS — BP 132/84 | HR 93 | Wt 193.6 lb

## 2021-02-09 DIAGNOSIS — Z3483 Encounter for supervision of other normal pregnancy, third trimester: Secondary | ICD-10-CM

## 2021-02-09 DIAGNOSIS — Z3A33 33 weeks gestation of pregnancy: Secondary | ICD-10-CM

## 2021-02-09 DIAGNOSIS — Z8659 Personal history of other mental and behavioral disorders: Secondary | ICD-10-CM

## 2021-02-09 DIAGNOSIS — R011 Cardiac murmur, unspecified: Secondary | ICD-10-CM

## 2021-02-09 NOTE — Progress Notes (Signed)
   PRENATAL VISIT NOTE  Subjective:  Alison Mcbride is a 25 y.o. G2P1001 at [redacted]w[redacted]d being seen today for ongoing prenatal care.  She is currently monitored for the following issues for this low-risk pregnancy and has Supervision of normal intrauterine pregnancy in multigravida; History of depression; Marijuana use; Positive depression screening; Anxiety; Asthma; Seasonal allergies; Low-lying placenta; and Heart murmur on their problem list.  Patient reports no complaints.  Contractions: Irregular. Vag. Bleeding: None.  Movement: Present. Denies leaking of fluid.   The following portions of the patient's history were reviewed and updated as appropriate: allergies, current medications, past family history, past medical history, past social history, past surgical history and problem list.   Objective:   Vitals:   02/09/21 0842  BP: 132/84  Pulse: 93  Weight: 193 lb 9.6 oz (87.8 kg)    Fetal Status: Fetal Heart Rate (bpm): 134 Fundal Height: 33 cm Movement: Present     General:  Alert, oriented and cooperative. Patient is in no acute distress.  Skin: Skin is warm and dry. No rash noted.   Cardiovascular: Normal heart rate noted  Respiratory: Normal respiratory effort, no problems with respiration noted  Abdomen: Soft, gravid, appropriate for gestational age.  Pain/Pressure: Present     Pelvic: Cervical exam deferred        Extremities: Normal range of motion.  Edema: None  Mental Status: Normal mood and affect. Normal behavior. Normal judgment and thought content.   Assessment and Plan:  Pregnancy: G2P1001 at [redacted]w[redacted]d 1. [redacted] weeks gestation of pregnancy - on PNV - hoping for water birth.  Has done class.  Next appt will be with CNM.  2. Supervision of normal intrauterine pregnancy in multigravida in third trimester  3. Heart murmur - has seen Dr. Servando Salina  4. History of depression - has declined need for treatment at this time  5.  H/o low lying placenta that has resolved   Preterm  labor symptoms and general obstetric precautions including but not limited to vaginal bleeding, contractions, leaking of fluid and fetal movement were reviewed in detail with the patient. Please refer to After Visit Summary for other counseling recommendations.   Return in about 2 weeks (around 02/23/2021).  Future Appointments  Date Time Provider Department Center  02/23/2021  8:55 AM Leftwich-Kirby, Wilmer Floor, CNM [facility] None  03/05/2021 10:30 AM Jerene Bears, MD [facility]  03/17/2021  8:45 AM Jerene Bears, MD [facility]  03/23/2021  8:30 AM Jerene Bears, MD [facility]  03/30/2021  8:45 AM Jerene Bears, MD [facility]    Jerene Bears, MD

## 2021-02-10 ENCOUNTER — Encounter (HOSPITAL_BASED_OUTPATIENT_CLINIC_OR_DEPARTMENT_OTHER): Payer: Self-pay | Admitting: *Deleted

## 2021-02-10 ENCOUNTER — Encounter (HOSPITAL_BASED_OUTPATIENT_CLINIC_OR_DEPARTMENT_OTHER): Payer: Self-pay | Admitting: Obstetrics & Gynecology

## 2021-02-11 ENCOUNTER — Encounter (HOSPITAL_BASED_OUTPATIENT_CLINIC_OR_DEPARTMENT_OTHER): Payer: Self-pay | Admitting: *Deleted

## 2021-02-18 ENCOUNTER — Encounter (HOSPITAL_BASED_OUTPATIENT_CLINIC_OR_DEPARTMENT_OTHER): Payer: Self-pay | Admitting: *Deleted

## 2021-02-18 ENCOUNTER — Encounter (HOSPITAL_BASED_OUTPATIENT_CLINIC_OR_DEPARTMENT_OTHER): Payer: Self-pay | Admitting: Obstetrics & Gynecology

## 2021-02-23 ENCOUNTER — Ambulatory Visit (INDEPENDENT_AMBULATORY_CARE_PROVIDER_SITE_OTHER): Payer: Medicaid Other | Admitting: Advanced Practice Midwife

## 2021-02-23 ENCOUNTER — Encounter: Payer: Self-pay | Admitting: Advanced Practice Midwife

## 2021-02-23 ENCOUNTER — Other Ambulatory Visit: Payer: Self-pay

## 2021-02-23 VITALS — BP 110/71 | HR 83 | Wt 198.0 lb

## 2021-02-23 DIAGNOSIS — Z3A35 35 weeks gestation of pregnancy: Secondary | ICD-10-CM

## 2021-02-23 DIAGNOSIS — Z3483 Encounter for supervision of other normal pregnancy, third trimester: Secondary | ICD-10-CM

## 2021-02-23 NOTE — Progress Notes (Signed)
   PRENATAL VISIT NOTE  Subjective:  Alison Mcbride is a 25 y.o. G2P1001 at [redacted]w[redacted]d being seen today for ongoing prenatal care.  She is currently monitored for the following issues for this low-risk pregnancy and has Supervision of normal intrauterine pregnancy in multigravida; History of depression; Marijuana use; Positive depression screening; Anxiety; Asthma; Seasonal allergies; Low-lying placenta; and Heart murmur on their problem list.  Patient reports occasional contractions.  Contractions: Irregular. Vag. Bleeding: None.  Movement: Present. Denies leaking of fluid.   The following portions of the patient's history were reviewed and updated as appropriate: allergies, current medications, past family history, past medical history, past social history, past surgical history and problem list.   Objective:   Vitals:   02/23/21 0914  BP: 110/71  Pulse: 83  Weight: 198 lb (89.8 kg)    Fetal Status: Fetal Heart Rate (bpm): 138   Movement: Present     General:  Alert, oriented and cooperative. Patient is in no acute distress.  Skin: Skin is warm and dry. No rash noted.   Cardiovascular: Normal heart rate noted  Respiratory: Normal respiratory effort, no problems with respiration noted  Abdomen: Soft, gravid, appropriate for gestational age.  Pain/Pressure: Present     Pelvic: Cervical exam deferred        Extremities: Normal range of motion.  Edema: None  Mental Status: Normal mood and affect. Normal behavior. Normal judgment and thought content.   Assessment and Plan:  Pregnancy: G2P1001 at [redacted]w[redacted]d  1. Supervision of normal intrauterine pregnancy in multigravida in third trimester --Anticipatory guidance about next visits/weeks of pregnancy given. --Interested in waterbirth, questions answered. Pt has class certificate.  Plan to transfer pt to office to see midwives as soon as appointments available. Discussed waterbirth as option for low risk pregnancy. Pregnancy is hard to predict  and things may arise that prevent immersion in water but labor can be supported with freedom of movement, birthing ball, shower and birth can be supported in position of pt choosing, etc, even if waterbirth becomes unavailable. Pt states understanding.  --Next visit in 2 weeks for GBS/GCC  2. [redacted] weeks gestation of pregnancy  Preterm labor symptoms and general obstetric precautions including but not limited to vaginal bleeding, contractions, leaking of fluid and fetal movement were reviewed in detail with the patient. Please refer to After Visit Summary for other counseling recommendations.   No follow-ups on file.  Future Appointments  Date Time Provider Department Center  03/05/2021 10:30 AM Jerene Bears, MD [facility]  03/23/2021  8:55 AM Courtney Paris, Wilmer Floor, CNM [facility] None  03/30/2021  8:35 AM Leftwich-Kirby, Wilmer Floor, CNM [facility] None    Alison Mcbride, CNM

## 2021-02-23 NOTE — Patient Instructions (Signed)
?  Considering Waterbirth? ?Guide for patients at [facility] ([facility]) ?Why consider waterbirth? ?Gentle birth for babies  ?Less pain medicine used in labor  ?May allow for passive descent/less pushing  ?May reduce perineal tears  ?More mobility and instinctive maternal position changes  ?Increased maternal relaxation  ? ?Is waterbirth safe? What are the risks of infection, drowning or other complications? ?Infection:  ?Very low risk (3.7 % for tub vs 4.8% for bed)  ?7 in 8000 waterbirths with documented infection  ?Poorly cleaned equipment most common cause  ?Slightly lower group B strep transmission rate  ?Drowning  ?Maternal:  ?Very low risk  ?Related to seizures or fainting  ?Newborn:  ?Very low risk. No evidence of increased risk of respiratory problems in multiple large studies  ?Physiological protection from breathing under water  ?Avoid underwater birth if there are any fetal complications  ?Once baby's head is out of the water, keep it out.  ?Birth complication  ?Some reports of cord trauma, but risk decreased by bringing baby to surface gradually  ?No evidence of increased risk of shoulder dystocia. Mothers can usually change positions faster in water than in a bed, possibly aiding the maneuvers to free the shoulder.  ? ?There are 2 things you MUST do to have a waterbirth with [facility]: ?Attend a waterbirth class at [facility] [place]   ?3rd Wednesday of every month from 7-9 pm (virtual during COVID) ?Free ?Register online at [email or web address] or [email or web address] or by calling [phone] ?Bring us the certificate from the class to your prenatal appointment or send via MyChart ?Meet with a midwife at 36 weeks* to see if you can still plan a waterbirth and to sign the consent.  ? ?*We also recommend that you schedule as many of your prenatal visits with a midwife as possible.   ? ?Helpful information: ?You may want to bring a bathing suit top to the hospital  to wear during labor but this is optional.  All other supplies are provided by the hospital. ?Please arrive at the hospital with signs of active labor, and do not wait at home until late in labor. It takes 45 min- 2 hours for COVID testing, fetal monitoring, and check in to your room to take place, plus transport and filling of the waterbirth tub.   ? ?Things that would prevent you from having a waterbirth: ?Unknown or Positive COVID-19 diagnosis upon admission to hospital* ?Premature, <37wks  ?Previous cesarean birth  ?Presence of thick meconium-stained fluid  ?Multiple gestation (Twins, triplets, etc.)  ?Uncontrolled diabetes or gestational diabetes requiring medication  ?Hypertension diagnosed in pregnancy or preexisting hypertension (gestational hypertension, preeclampsia, or chronic hypertension) ?Fetal growth restriction (your baby measures less than 10th percentile on ultrasound) ?Heavy vaginal bleeding  ?Non-reassuring fetal heart rate  ?Active infection (MRSA, etc.). Group B Strep is NOT a contraindication for waterbirth.  ?If your labor has to be induced and induction method requires continuous monitoring of the baby's heart rate  ?Other risks/issues identified by your obstetrical provider  ? ?Please remember that birth is unpredictable. Under certain unforeseeable circumstances your provider may advise against giving birth in the tub. These decisions will be made on a case-by-case basis and with the safety of you and your baby as our highest priority. ? ? ?*Please remember that in order to have a waterbirth, you must test Negative to COVID-19 upon admission to the hospital. ? ?Updated 06/23/20 ? ?

## 2021-02-26 ENCOUNTER — Encounter (HOSPITAL_BASED_OUTPATIENT_CLINIC_OR_DEPARTMENT_OTHER): Payer: Self-pay | Admitting: Obstetrics & Gynecology

## 2021-03-05 ENCOUNTER — Encounter (HOSPITAL_BASED_OUTPATIENT_CLINIC_OR_DEPARTMENT_OTHER): Payer: Self-pay | Admitting: *Deleted

## 2021-03-05 ENCOUNTER — Ambulatory Visit (INDEPENDENT_AMBULATORY_CARE_PROVIDER_SITE_OTHER): Payer: Medicaid Other | Admitting: Obstetrics & Gynecology

## 2021-03-05 ENCOUNTER — Other Ambulatory Visit (HOSPITAL_COMMUNITY)
Admission: RE | Admit: 2021-03-05 | Discharge: 2021-03-05 | Disposition: A | Discharge status: Home / Self Care | Payer: Medicaid Other | Source: Ambulatory Visit | Attending: Obstetrics & Gynecology | Admitting: Obstetrics & Gynecology

## 2021-03-05 ENCOUNTER — Other Ambulatory Visit: Payer: Self-pay

## 2021-03-05 VITALS — BP 128/80 | HR 86 | Wt 200.4 lb

## 2021-03-05 DIAGNOSIS — R011 Cardiac murmur, unspecified: Secondary | ICD-10-CM

## 2021-03-05 DIAGNOSIS — Z3483 Encounter for supervision of other normal pregnancy, third trimester: Secondary | ICD-10-CM | POA: Diagnosis present

## 2021-03-05 DIAGNOSIS — Z3A36 36 weeks gestation of pregnancy: Secondary | ICD-10-CM | POA: Diagnosis not present

## 2021-03-05 DIAGNOSIS — O4440 Low lying placenta NOS or without hemorrhage, unspecified trimester: Secondary | ICD-10-CM

## 2021-03-05 DIAGNOSIS — F419 Anxiety disorder, unspecified: Secondary | ICD-10-CM

## 2021-03-06 LAB — CERVICOVAGINAL ANCILLARY ONLY
Chlamydia: NEGATIVE
Comment: NEGATIVE
Comment: NORMAL
Neisseria Gonorrhea: NEGATIVE

## 2021-03-08 DIAGNOSIS — B951 Streptococcus, group B, as the cause of diseases classified elsewhere: Secondary | ICD-10-CM | POA: Insufficient documentation

## 2021-03-08 LAB — CULTURE, BETA STREP (GROUP B ONLY): Strep Gp B Culture: POSITIVE — AB

## 2021-03-08 NOTE — Progress Notes (Signed)
° °  PRENATAL VISIT NOTE  Subjective:  Alison Mcbride is a 25 y.o. G2P1001 at [redacted]w[redacted]d being seen today for ongoing prenatal care.  She is currently monitored for the following issues for this low-risk pregnancy and has Supervision of normal intrauterine pregnancy in multigravida; History of depression; Marijuana use; Positive depression screening; Anxiety; Asthma; Seasonal allergies; Low-lying placenta; Heart murmur; and Group beta Strep positive on their problem list.  Patient reports  some contractions that at time can be very regular.  Has not lasted for more than an hour when occurs .  Contractions: Irregular.  .  Movement: Present. Denies leaking of fluid.   The following portions of the patient's history were reviewed and updated as appropriate: allergies, current medications, past family history, past medical history, past social history, past surgical history and problem list.   Objective:   Vitals:   03/05/21 1054  BP: 128/80  Pulse: 86  Weight: 200 lb 6.4 oz (90.9 kg)    Fetal Status: Fetal Heart Rate (bpm): 135 Fundal Height: 36 cm Movement: Present     General:  Alert, oriented and cooperative. Patient is in no acute distress.  Skin: Skin is warm and dry. No rash noted.   Cardiovascular: Normal heart rate noted  Respiratory: Normal respiratory effort, no problems with respiration noted  Abdomen: Soft, gravid, appropriate for gestational age.  Pain/Pressure: Present     Pelvic: Cervical exam deferred        Extremities: Normal range of motion.  Edema: None  Mental Status: Normal mood and affect. Normal behavior. Normal judgment and thought content.   Assessment and Plan:  Pregnancy: G2P1001 at [redacted]w[redacted]d 1. Supervision of normal intrauterine pregnancy in multigravida in third trimester - on PNV - planning water birth and next appt is with CNM at [facility]  2. [redacted] weeks gestation of pregnancy - Culture, beta strep (group b only) - Cervicovaginal ancillary only( [place])  3.  Heart murmur - has seen Dr. Lorrin Jackson with echo  4. Anxiety - declines need for treatment  5. Low-lying placenta - resolved with f/u ultrasound 12/15/2020  Preterm labor symptoms and general obstetric precautions including but not limited to vaginal bleeding, contractions, leaking of fluid and fetal movement were reviewed in detail with the patient. Please refer to After Visit Summary for other counseling recommendations.   Return in about 1 week (around 03/12/2021).  Future Appointments  Date Time Provider Department Center  03/23/2021  8:55 AM Leftwich-Kirby, Wilmer Floor, CNM [facility] None  03/24/2021  8:15 AM [facility]  03/30/2021  8:35 AM Leftwich-Kirby, Wilmer Floor, CNM [facility] None    Jerene Bears, MD

## 2021-03-09 ENCOUNTER — Encounter (HOSPITAL_BASED_OUTPATIENT_CLINIC_OR_DEPARTMENT_OTHER): Payer: Self-pay | Admitting: Obstetrics & Gynecology

## 2021-03-10 NOTE — BH Specialist Note (Signed)
Pt did not arrive to video visit and did not answer the phone; Left HIPPA-compliant message to call back Dariya Gainer from [facility] at  [phone] (Margues Filippini's office).  ?; left MyChart message for patient.  ? ?

## 2021-03-15 NOTE — L&D Delivery Note (Signed)
Delivery Note At 11:20 PM a viable and healthy female was delivered via  (Presentation: Right Occiput Anterior).  APGAR: 9, 9 ; weight  pending.   Placenta status: Spontaneous, Intact.  Cord: 3 vessels with the following complications: None.    Anesthesia: None Episiotomy: None Lacerations: None Suture Repair:  n/a Est. Blood Loss (mL): 200  Mom to postpartum.  Baby to Couplet care / Skin to Skin.  Alison Mcbride is a 26 y.o. female G2P1001 with IUP at [redacted]w[redacted]d admitted for active labor at term.  She progressed without augmentation to complete and pushed with one contraction to deliver.  Dr Annia Friendly to room initially and performed delivery, Cord clamping delayed by several minutes then clamped by CNM and cut by FOB.  Placenta intact and spontaneous, bleeding minimal.  Intact perineum without repair.  Mom and baby stable prior to transfer to postpartum. She plans on breastfeeding. She does not desire contraception.  Sharen Counter 03/31/2021, 11:58 PM

## 2021-03-17 ENCOUNTER — Encounter (HOSPITAL_BASED_OUTPATIENT_CLINIC_OR_DEPARTMENT_OTHER): Payer: Self-pay | Admitting: Obstetrics & Gynecology

## 2021-03-17 NOTE — Telephone Encounter (Signed)
Called pt in response to myChart message about elevated BP and SOB. Pt states that she had been going up and down steps this morning before she took her BP. She gets SOB with activity. She denies HA, vision changes, or epigastric pain. Denies vaginal bleeding, leaking of fluid. Reports intermittent cramping and positive fetal movement. Advised pt to recheck BP after she had been sitting some. Advised if BP is elevated and she has any of the above symptoms, she should go to MAU for evaluation. Otherwise, pt given appt for BP check in office tomorrow.

## 2021-03-18 ENCOUNTER — Encounter (HOSPITAL_BASED_OUTPATIENT_CLINIC_OR_DEPARTMENT_OTHER): Payer: Medicaid Other | Admitting: Obstetrics & Gynecology

## 2021-03-18 ENCOUNTER — Encounter (HOSPITAL_BASED_OUTPATIENT_CLINIC_OR_DEPARTMENT_OTHER): Payer: Self-pay | Admitting: Obstetrics & Gynecology

## 2021-03-23 ENCOUNTER — Other Ambulatory Visit: Payer: Self-pay

## 2021-03-23 ENCOUNTER — Encounter (HOSPITAL_BASED_OUTPATIENT_CLINIC_OR_DEPARTMENT_OTHER): Payer: Self-pay | Admitting: Obstetrics & Gynecology

## 2021-03-23 ENCOUNTER — Ambulatory Visit (INDEPENDENT_AMBULATORY_CARE_PROVIDER_SITE_OTHER): Payer: Medicaid Other | Admitting: Advanced Practice Midwife

## 2021-03-23 VITALS — BP 122/83 | HR 73 | Wt 198.0 lb

## 2021-03-23 DIAGNOSIS — Z3483 Encounter for supervision of other normal pregnancy, third trimester: Secondary | ICD-10-CM

## 2021-03-23 DIAGNOSIS — R011 Cardiac murmur, unspecified: Secondary | ICD-10-CM

## 2021-03-23 DIAGNOSIS — O4440 Low lying placenta NOS or without hemorrhage, unspecified trimester: Secondary | ICD-10-CM | POA: Diagnosis not present

## 2021-03-23 DIAGNOSIS — Z3A39 39 weeks gestation of pregnancy: Secondary | ICD-10-CM

## 2021-03-23 NOTE — Patient Instructions (Signed)
Things to Try After 37 weeks to Encourage Labor/Get Ready for Labor:    Try the [employer] Circuit at www.milescircuit.com daily to improve baby's position and encourage the onset of labor.  Walk a little and rest a little every day.  Change positions often.  Cervical Ripening: May try one or both Red Raspberry Leaf capsules or tea:  two 300mg or 400mg tablets with each meal, 2-3 times a day, or 1-3 cups of tea daily  Potential Side Effects Of Raspberry Leaf:  Most women do not experience any side effects from drinking raspberry leaf tea. However, nausea and loose stools are possible   Evening Primrose Oil capsules: take 1 capsule by mouth and place one capsule in the vagina every night.    Some of the potential side effects:  Upset stomach  Loose stools or diarrhea  Headaches  Nausea  Sex can also help the cervix ripen and encourage labor onset.    Labor Precautions Reasons to come to [facility] at [facility]:  1.  Contractions are  5 minutes apart or less, each last 1 minute, these have been going on for 1-2 hours, and you cannot walk or talk during them 2.  You have a large gush of fluid, or a trickle of fluid that will not stop and you have to wear a pad 3.  You have bleeding that is bright red, heavier than spotting--like menstrual bleeding (spotting can be normal in early labor or after a check of your cervix) 4.  You do not feel the baby moving like he/she normally does  

## 2021-03-23 NOTE — Progress Notes (Signed)
Pt would like cervix check today.  

## 2021-03-23 NOTE — Progress Notes (Signed)
° °  PRENATAL VISIT NOTE  Subjective:  Alison Mcbride is a 26 y.o. G2P1001 at 106w0d being seen today for ongoing prenatal care.  She is currently monitored for the following issues for this low-risk pregnancy and has Supervision of normal intrauterine pregnancy in multigravida; History of depression; Marijuana use; Positive depression screening; Anxiety; Asthma; Seasonal allergies; Low-lying placenta; Heart murmur; and Group beta Strep positive on their problem list.  Patient reports occasional contractions.  Contractions: Irregular.  .  Movement: Present. Denies leaking of fluid.   The following portions of the patient's history were reviewed and updated as appropriate: allergies, current medications, past family history, past medical history, past social history, past surgical history and problem list.   Objective:   Vitals:   03/23/21 0901  BP: 122/83  Pulse: 73  Weight: 198 lb (89.8 kg)    Fetal Status: Fetal Heart Rate (bpm): 130 Fundal Height: 38 cm Movement: Present  Presentation: Vertex  General:  Alert, oriented and cooperative. Patient is in no acute distress.  Skin: Skin is warm and dry. No rash noted.   Cardiovascular: Normal heart rate noted  Respiratory: Normal respiratory effort, no problems with respiration noted  Abdomen: Soft, gravid, appropriate for gestational age.  Pain/Pressure: Present     Pelvic: Cervical exam performed in the presence of a chaperone Dilation: 1 Effacement (%): 50 Station: -2  Extremities: Normal range of motion.     Mental Status: Normal mood and affect. Normal behavior. Normal judgment and thought content.   Assessment and Plan:  Pregnancy: G2P1001 at [redacted]w[redacted]d 1. Supervision of normal intrauterine pregnancy in multigravida in third trimester --Anticipatory guidance about next visits/weeks of pregnancy given. --Pt plans waterbirth, no barriers at this time.  --Cervix 1/50/-2, vertex, membranes swept at pt request  2. Heart murmur --Follow up  with cardiology for heart murmur more than expected for pregnancy. Pt without symptoms. --Normal cardiology follow up, normal cardiac echo, no restrictions, no further follow up per cardiology  3. Low-lying placenta --Resolved  4. [redacted] weeks gestation of pregnancy   Term labor symptoms and general obstetric precautions including but not limited to vaginal bleeding, contractions, leaking of fluid and fetal movement were reviewed in detail with the patient. Please refer to After Visit Summary for other counseling recommendations.   Return in about 1 week (around 03/30/2021).  Future Appointments  Date Time Provider [place]  03/24/2021  8:15 AM [facility]  03/30/2021  8:35 AM Leftwich-Kirby, Kathie Dike, CNM [facility] None    Fatima Blank, CNM

## 2021-03-24 ENCOUNTER — Ambulatory Visit: Payer: Medicaid Other | Admitting: Clinical

## 2021-03-24 DIAGNOSIS — Z91199 Patient's noncompliance with other medical treatment and regimen due to unspecified reason: Secondary | ICD-10-CM

## 2021-03-25 ENCOUNTER — Encounter: Payer: Self-pay | Admitting: *Deleted

## 2021-03-25 ENCOUNTER — Encounter: Payer: Self-pay | Admitting: Advanced Practice Midwife

## 2021-03-25 ENCOUNTER — Encounter (HOSPITAL_BASED_OUTPATIENT_CLINIC_OR_DEPARTMENT_OTHER): Payer: Self-pay | Admitting: Obstetrics & Gynecology

## 2021-03-30 ENCOUNTER — Encounter (HOSPITAL_BASED_OUTPATIENT_CLINIC_OR_DEPARTMENT_OTHER): Payer: Self-pay | Admitting: Obstetrics & Gynecology

## 2021-03-30 ENCOUNTER — Encounter: Payer: Self-pay | Admitting: Advanced Practice Midwife

## 2021-03-30 ENCOUNTER — Ambulatory Visit (INDEPENDENT_AMBULATORY_CARE_PROVIDER_SITE_OTHER): Payer: Medicaid Other | Admitting: Advanced Practice Midwife

## 2021-03-30 ENCOUNTER — Other Ambulatory Visit: Payer: Self-pay

## 2021-03-30 VITALS — BP 125/79 | HR 81 | Wt 201.6 lb

## 2021-03-30 DIAGNOSIS — Z3483 Encounter for supervision of other normal pregnancy, third trimester: Secondary | ICD-10-CM | POA: Diagnosis not present

## 2021-03-30 DIAGNOSIS — Z3A40 40 weeks gestation of pregnancy: Secondary | ICD-10-CM

## 2021-03-30 NOTE — Progress Notes (Signed)
° °  PRENATAL VISIT NOTE  Subjective:  Alison Mcbride is a 26 y.o. G2P1001 at [redacted]w[redacted]d being seen today for ongoing prenatal care.  She is currently monitored for the following issues for this low-risk pregnancy and has Supervision of normal intrauterine pregnancy in multigravida; History of depression; Marijuana use; Positive depression screening; Anxiety; Asthma; Seasonal allergies; Low-lying placenta; Heart murmur; and Group beta Strep positive on their problem list.  Patient reports occasional contractions.  Contractions: Irregular.  .  Movement: Present. Denies leaking of fluid.   The following portions of the patient's history were reviewed and updated as appropriate: allergies, current medications, past family history, past medical history, past social history, past surgical history and problem list.   Objective:   Vitals:   03/30/21 0919  BP: 125/79  Pulse: 81  Weight: 201 lb 9.6 oz (91.4 kg)    Fetal Status: Fetal Heart Rate (bpm): NST Fundal Height: 38 cm Movement: Present  Presentation: Vertex  General:  Alert, oriented and cooperative. Patient is in no acute distress.  Skin: Skin is warm and dry. No rash noted.   Cardiovascular: Normal heart rate noted  Respiratory: Normal respiratory effort, no problems with respiration noted  Abdomen: Soft, gravid, appropriate for gestational age.  Pain/Pressure: Present     Pelvic: Cervical exam deferred Dilation: 2 Effacement (%): 50 Station: -2  Extremities: Normal range of motion.     Mental Status: Normal mood and affect. Normal behavior. Normal judgment and thought content.   Assessment and Plan:  Pregnancy: G2P1001 at [redacted]w[redacted]d 1. Supervision of normal intrauterine pregnancy in multigravida in third trimester --Anticipatory guidance about next visits/weeks of pregnancy given. --No barriers to waterbirth at this time --Membranes swept at pt request today, scant bleeding on glove following exam  --Pt declines to schedule postdates IOL  at this time --Next appt for NST on 1/20 then appt on 1/23 with CNM  --Pt will be 41.0 on 1/23 appt so will need to discuss/schedule IOL at that time. Pt states understanding.  --Fetal kick counts and labor precautions reviewed, reasons to go to hospital   2. [redacted] weeks gestation of pregnancy --NST today, with one 15 x 15 and many 10 x 10s initially, pt remained on monitor and was given ice water, position changed so not supine and 15 x 15 accels x 3-4 noted.   Preterm labor symptoms and general obstetric precautions including but not limited to vaginal bleeding, contractions, leaking of fluid and fetal movement were reviewed in detail with the patient. Please refer to After Visit Summary for other counseling recommendations.   No follow-ups on file.  Future Appointments  Date Time Provider Department Center  04/03/2021  8:35 AM [facility] INTAKE [facility] None  04/06/2021  8:35 AM Leftwich-Kirby, Wilmer Floor, CNM [facility] None     Sharen Counter, CNM

## 2021-03-30 NOTE — Patient Instructions (Signed)
Things to Try After 37 weeks to Encourage Labor/Get Ready for Labor:    Try the Miles Circuit at www.milescircuit.com daily to improve baby's position and encourage the onset of labor.  Walk a little and rest a little every day.  Change positions often.  Cervical Ripening: May try one or both Red Raspberry Leaf capsules or tea:  two 300mg or 400mg tablets with each meal, 2-3 times a day, or 1-3 cups of tea daily  Potential Side Effects Of Raspberry Leaf:  Most women do not experience any side effects from drinking raspberry leaf tea. However, nausea and loose stools are possible   Evening Primrose Oil capsules: take 1 capsule by mouth and place one capsule in the vagina every night.    Some of the potential side effects:  Upset stomach  Loose stools or diarrhea  Headaches  Nausea  Sex can also help the cervix ripen and encourage labor onset.    Labor Precautions Reasons to come to [facility]U at [facility]:  1.  Contractions are  5 minutes apart or less, each last 1 minute, these have been going on for 1-2 hours, and you cannot walk or talk during them 2.  You have a large gush of fluid, or a trickle of fluid that will not stop and you have to wear a pad 3.  You have bleeding that is bright red, heavier than spotting--like menstrual bleeding (spotting can be normal in early labor or after a check of your cervix) 4.  You do not feel the baby moving like he/she normally does  

## 2021-03-31 ENCOUNTER — Inpatient Hospital Stay (HOSPITAL_COMMUNITY)
Admission: AD | Admit: 2021-03-31 | Discharge: 2021-04-02 | DRG: 807 | Disposition: A | Discharge status: Home / Self Care | Payer: Medicaid Other | Attending: Obstetrics and Gynecology | Admitting: Obstetrics and Gynecology

## 2021-03-31 ENCOUNTER — Encounter (HOSPITAL_COMMUNITY): Payer: Self-pay | Admitting: Obstetrics and Gynecology

## 2021-03-31 ENCOUNTER — Other Ambulatory Visit: Payer: Self-pay

## 2021-03-31 ENCOUNTER — Encounter: Payer: Self-pay | Admitting: Advanced Practice Midwife

## 2021-03-31 DIAGNOSIS — O480 Post-term pregnancy: Principal | ICD-10-CM | POA: Diagnosis present

## 2021-03-31 DIAGNOSIS — Z3483 Encounter for supervision of other normal pregnancy, third trimester: Secondary | ICD-10-CM | POA: Diagnosis not present

## 2021-03-31 DIAGNOSIS — O99824 Streptococcus B carrier state complicating childbirth: Secondary | ICD-10-CM | POA: Diagnosis present

## 2021-03-31 DIAGNOSIS — Z3A40 40 weeks gestation of pregnancy: Secondary | ICD-10-CM

## 2021-03-31 DIAGNOSIS — Z3480 Encounter for supervision of other normal pregnancy, unspecified trimester: Secondary | ICD-10-CM

## 2021-03-31 DIAGNOSIS — B951 Streptococcus, group B, as the cause of diseases classified elsewhere: Secondary | ICD-10-CM

## 2021-03-31 DIAGNOSIS — Z20822 Contact with and (suspected) exposure to covid-19: Secondary | ICD-10-CM | POA: Diagnosis present

## 2021-03-31 DIAGNOSIS — O26893 Other specified pregnancy related conditions, third trimester: Secondary | ICD-10-CM | POA: Diagnosis present

## 2021-03-31 LAB — RESP PANEL BY RT-PCR (FLU A&B, COVID) ARPGX2
Influenza A by PCR: NEGATIVE
Influenza B by PCR: NEGATIVE
SARS Coronavirus 2 by RT PCR: NEGATIVE

## 2021-03-31 LAB — TYPE AND SCREEN
ABO/RH(D): O POS
Antibody Screen: NEGATIVE

## 2021-03-31 LAB — CBC
HCT: 33.5 % — ABNORMAL LOW (ref 36.0–46.0)
Hemoglobin: 10.7 g/dL — ABNORMAL LOW (ref 12.0–15.0)
MCH: 26.5 pg (ref 26.0–34.0)
MCHC: 31.9 g/dL (ref 30.0–36.0)
MCV: 82.9 fL (ref 80.0–100.0)
Platelets: 311 10*3/uL (ref 150–400)
RBC: 4.04 MIL/uL (ref 3.87–5.11)
RDW: 14.6 % (ref 11.5–15.5)
WBC: 8.4 10*3/uL (ref 4.0–10.5)
nRBC: 0 % (ref 0.0–0.2)

## 2021-03-31 MED ORDER — OXYTOCIN-SODIUM CHLORIDE 30-0.9 UT/500ML-% IV SOLN
2.5000 [IU]/h | INTRAVENOUS | Status: DC
  Filled 2021-03-31: qty 500

## 2021-03-31 MED ORDER — LACTATED RINGERS IV SOLN: 500.0000 mL | INTRAVENOUS | Status: DC | PRN

## 2021-03-31 MED ORDER — SOD CITRATE-CITRIC ACID 500-334 MG/5ML PO SOLN: 30.0000 mL | ORAL | Status: DC | PRN

## 2021-03-31 MED ORDER — LIDOCAINE HCL (PF) 1 % IJ SOLN: 30.0000 mL | INTRAMUSCULAR | Status: DC | PRN

## 2021-03-31 MED ORDER — SODIUM CHLORIDE 0.9 % IV SOLN
5.0000 10*6.[IU] | Freq: Once | INTRAVENOUS | Status: AC
  Administered 2021-03-31: 5 10*6.[IU] via INTRAVENOUS
  Filled 2021-03-31: qty 5

## 2021-03-31 MED ORDER — OXYTOCIN BOLUS FROM INFUSION
333.0000 mL | Freq: Once | INTRAVENOUS | Status: AC
  Administered 2021-03-31: 333 mL via INTRAVENOUS

## 2021-03-31 MED ORDER — ONDANSETRON HCL 4 MG/2ML IJ SOLN: 4.0000 mg | Freq: Four times a day (QID) | INTRAMUSCULAR | Status: DC | PRN

## 2021-03-31 MED ORDER — ACETAMINOPHEN 325 MG PO TABS: 650.0000 mg | ORAL_TABLET | ORAL | Status: DC | PRN

## 2021-03-31 MED ORDER — LACTATED RINGERS IV SOLN: INTRAVENOUS | Status: DC

## 2021-03-31 MED ORDER — PENICILLIN G POT IN DEXTROSE 60000 UNIT/ML IV SOLN: 3.0000 10*6.[IU] | INTRAVENOUS | Status: DC

## 2021-03-31 NOTE — H&P (Signed)
OBSTETRIC ADMISSION HISTORY AND PHYSICAL  Alison Mcbride is a 26 y.o. female G2P1001 with IUP at [redacted]w[redacted]d by LMP presenting for Contractions. She reports +FMs, No LOF, no VB, no blurry vision, headaches or peripheral edema, and RUQ pain.  She plans on breast feeding. She does not want birth control. Desires Water Birth. She received her prenatal care at [place] and transferred to [place] to see midwives prior to waterbirth.  Dating: By U/S 12/15/20 --->  Estimated Date of Delivery: 03/30/21  Sono:    @[redacted]w[redacted]d , CWD, normal anatomy, Breech presentation, Anterior lie, 792g, 53% EFW   Prenatal History/Complications:  Hx of Low Lying Placenta Depression GBS +  Past Medical History: Past Medical History:  Diagnosis Date   Anxiety    Asthma    Seasonal allergies     Past Surgical History: Past Surgical History:  Procedure Laterality Date   ANKLE SURGERY     WISDOM TOOTH EXTRACTION      Obstetrical History: OB History     Gravida  2   Para  1   Term  1   Preterm      AB      Living  1      SAB      IAB      Ectopic      Multiple  0   Live Births  1           Social History Social History   Socioeconomic History   Marital status: Single    Spouse name: Not on file   Number of children: Not on file   Years of education: Not on file   Highest education level: Not on file  Occupational History   Not on file  Tobacco Use   Smoking status: Never   Smokeless tobacco: Never   Tobacco comments:    Smokes marijuana  Vaping Use   Vaping Use: Former  Substance and Sexual Activity   Alcohol use: Not Currently    Comment: occasional   Drug use: Not Currently    Types: Marijuana   Sexual activity: Yes    Birth control/protection: None  Other Topics Concern   Not on file  Social History Narrative   Not on file   Social Determinants of Health   Financial Resource Strain: High Risk   Difficulty of Paying Living Expenses: Hard  Food Insecurity: No  Food Insecurity   Worried About Running Out of Food in the Last Year: Never true   Ran Out of Food in the Last Year: Never true  Transportation Needs: No Transportation Needs   Lack of Transportation (Medical): No   Lack of Transportation (Non-Medical): No  Physical Activity: Sufficiently Active   Days of Exercise per Week: 5 days   Minutes of Exercise per Session: 100 min  Stress: Stress Concern Present   Feeling of Stress : To some extent  Social Connections: Unknown   Frequency of Communication with Friends and Family: More than three times a week   Frequency of Social Gatherings with Friends and Family: Twice a week   Attends Religious Services: 1 to 4 times per year   Active Member of [employer] or Organizations: No   Attends Archivist Meetings: Never   Marital Status: Not on file    Family History: Family History  Problem Relation Age of Onset   Hypertension Mother    Diabetes Mother    Hypertension Father    Asthma Son    Lupus  Maternal Aunt    Cancer Maternal Grandmother    Cancer Maternal Grandfather        liver    Allergies: No Known Allergies  Medications Prior to Admission  Medication Sig Dispense Refill Last Dose   Multiple Vitamins-Minerals (WOMENS MULTIVITAMIN PO) Take by mouth.   03/31/2021   albuterol (PROVENTIL HFA;VENTOLIN HFA) 108 (90 Base) MCG/ACT inhaler Inhale 1-2 puffs into the lungs every 6 (six) hours as needed for wheezing or shortness of breath. 1 Inhaler 0    hydrocortisone (ANUSOL-HC) 2.5 % rectal cream Place rectally 2 (two) times daily. 30 g 1      Review of Systems   All systems reviewed and negative except as stated in HPI  Blood pressure 132/76, pulse 89, temperature 98.4 F (36.9 C), temperature source Oral, resp. rate 15, last menstrual period 06/23/2020, SpO2 99 %. General appearance: alert, cooperative, and no distress Lungs: clear to auscultation bilaterally Heart: regular rate and rhythm Abdomen: soft, non-tender;  bowel sounds normal Pelvic:  Extremities: Homans sign is negative, no sign of DVT DTR's wnl Presentation: cephalic Fetal monitoringBaseline: 135 bpm, Variability: moderate, Accelerations: present, and Decelerations: Absent Uterine activityFrequency: Every 5-8 minutes Dilation: 5 Effacement (%): 80 Station: -2 Exam by:: [employer] RN   Prenatal labs: ABO, Rh: --/--/PENDING (01/17 1915) Antibody: PENDING (01/17 1915) Rubella: 5.17 (06/09 1033) RPR: Non Reactive (11/02 1208)  HBsAg: NON REACTIVE (06/09 1033)  HIV: Non Reactive (11/02 1208)  GBS: Positive/-- (12/22 1151)  1 hr Glucola: 133 Genetic screening  AFP neg Anatomy US: Normal  Prenatal Transfer Tool  Maternal Diabetes: No Genetic Screening: Normal Maternal Ultrasounds/Referrals: Normal Fetal Ultrasounds or other Referrals:  None Maternal Substance Abuse:  No Significant Maternal Medications:  None Significant Maternal Lab Results: Group B Strep positive  Results for orders placed or performed during the hospital encounter of 03/31/21 (from the past 24 hour(s))  Resp Panel by RT-PCR (Flu A&B, Covid) Nasopharyngeal Swab   Collection Time: 03/31/21  5:41 PM   Specimen: Nasopharyngeal Swab; Nasopharyngeal(NP) swabs in vial transport medium  Result Value Ref Range   SARS Coronavirus 2 by RT PCR NEGATIVE NEGATIVE   Influenza A by PCR NEGATIVE NEGATIVE   Influenza B by PCR NEGATIVE NEGATIVE  Type and screen [place]   Collection Time: 03/31/21  7:15 PM  Result Value Ref Range   ABO/RH(D) PENDING    Antibody Screen PENDING    Sample Expiration      04/03/2021,2359 Performed at [facility], [address] [place], [place] [address]     Patient Active Problem List   Diagnosis Date Noted   Group beta Strep positive 03/08/2021   Low-lying placenta 11/23/2020   Heart murmur 11/23/2020   Anxiety 10/30/2020   Asthma 10/30/2020   Seasonal allergies 10/30/2020   Supervision of normal  intrauterine pregnancy in multigravida 08/21/2020   History of depression 08/21/2020   Marijuana use 08/21/2020   Positive depression screening 08/21/2020    Assessment/Plan:  Alison Mcbride is a 26 y.o. G2P1001 at [redacted]w[redacted]d here for contractions. Pt desires water birth.  #Labor: Contractions 5-8 min. apart #Pain: Coping well with labor support and positioning, desires water for labor/birth #FWB: Category I #ID:  GBS +, PCN ordered #MOF: Breast #MOC: Does Not Want #Circ:  Desires  #GBS+ Administer Penicillin.    Elie Goody, [place]  03/31/2021, 7:34 PM     Midwife attestation: I have seen and examined this patient; I agree with above documentation in  the student's note.   Alison Mcbride is a 26 y.o. [phone] here for active labor at term.  PE: BP 122/74 (BP Location: Left Arm)    Pulse 84    Temp 99 F (37.2 C) (Oral)    Resp 18    LMP 06/23/2020    SpO2 100%    Breastfeeding Unknown  Gen: calm comfortable, NAD Resp: normal effort, no distress Abd: gravid  ROS, labs, PMH reviewed  Plan: Admit to LD Labor: expectant management, no barriers to waterbirth Fetal monitoring: Category I, intermittent auscultation as desired ID: GBS positive, PCN ordered  Fatima Blank, CNM  04/01/2021, 8:42 PM

## 2021-03-31 NOTE — MAU Note (Signed)
Patient arrived from home complaining of contractions that started at 250am. 5-8 mins apart. Patient denies vaginal bleeding and or leakage of fluid. + fm reported.  Patient desires water birth

## 2021-03-31 NOTE — Discharge Summary (Signed)
Postpartum Discharge Summary     Patient Name: Alison Mcbride DOB: June 16, 1995 MRN: [id number]  Date of admission: 03/31/2021 Delivery date:03/31/2021  Delivering provider: Patriciaann Clan  Date of discharge: 04/02/2021  Admitting diagnosis: Normal labor [O80, Z37.9] Intrauterine pregnancy: [redacted]w[redacted]d    Secondary diagnosis:  Active Problems:   Supervision of normal intrauterine pregnancy in multigravida   SVD (spontaneous vaginal delivery)  Additional problems: none    Discharge diagnosis: Term Pregnancy Delivered                                              Post partum procedures: none Augmentation: N/A Complications: None  Hospital course: Onset of Labor With Vaginal Delivery      26y.o. yo G2P1001 at 436w1das admitted in Active Labor on 03/31/2021. Patient had an uncomplicated labor course as follows:  Membrane Rupture Time/Date: 11:08 PM ,03/31/2021   Delivery Method:Vaginal, Spontaneous  Episiotomy: None  Lacerations:  None  Patient had an uncomplicated postpartum course.  She is ambulating, tolerating a regular diet, passing flatus, and urinating well. Patient is discharged home in stable condition on 04/02/21.  Newborn Data: Birth date:03/31/2021  Birth time:11:20 PM  Gender:Female  Living status:Living  Apgars:9 ,9  Weight:3050 g   Magnesium Sulfate received: No BMZ received: No Rhophylac:No MMR:No T-DaP:Given prenatally Flu: Yes Transfusion:No  Physical exam  Vitals:   04/01/21 1033 04/01/21 1220 04/01/21 2145 04/02/21 0625  BP: 126/77 122/74 124/71 109/82  Pulse: 99 84 80 93  Resp: _0 Temp: 98.9 F (37.2 C) 99 F (37.2 C) 98.5 F (36.9 C) 98.8 F (37.1 C)  TempSrc: Oral Oral Oral Oral  SpO2:  100% 99% 100%   General: alert, cooperative, and no distress Lochia: appropriate Uterine Fundus: firm Incision: N/A DVT Evaluation: No evidence of DVT seen on physical exam. Labs: Lab Results  Component Value Date   WBC 8.4 03/31/2021   HGB  10.7 (L) 03/31/2021   HCT 33.5 (L) 03/31/2021   MCV 82.9 03/31/2021   PLT 311 03/31/2021   No flowsheet data found. Edinburgh Score: Edinburgh Postnatal Depression Scale Screening Tool 04/01/2021  I have been able to laugh and see the funny side of things. 0  I have looked forward with enjoyment to things. 0  I have blamed myself unnecessarily when things went wrong. 2  I have been anxious or worried for no good reason. 0  I have felt scared or panicky for no good reason. 1  Things have been getting on top of me. 0  I have been so unhappy that I have had difficulty sleeping. 0  I have felt sad or miserable. 0  I have been so unhappy that I have been crying. 0  The thought of harming myself has occurred to me. 0  Edinburgh Postnatal Depression Scale Total 3     After visit meds:  Allergies as of 04/02/2021   No Known Allergies      Medication List     TAKE these medications    albuterol 108 (90 Base) MCG/ACT inhaler Commonly known as: VENTOLIN HFA Inhale 1-2 puffs into the lungs every 6 (six) hours as needed for wheezing or shortness of breath.   hydrocortisone 2.5 % rectal cream Commonly known as: ANUSOL-HC Place rectally 2 (two) times daily.   ibuprofen 600 MG tablet Commonly  known as: ADVIL Take 1 tablet (600 mg total) by mouth every 6 (six) hours.   WOMENS MULTIVITAMIN PO Take by mouth.         Discharge home in stable condition Infant Feeding: Breast Infant Disposition:home with mother Discharge instruction: per After Visit Summary and Postpartum booklet. Activity: Advance as tolerated. Pelvic rest for 6 weeks.  Diet: routine diet Future Appointments: Future Appointments  Date Time Provider [place]  05/18/2021  1:10 PM Leftwich-Kirby, Kathie Dike, CNM [place] None   Follow up Visit:  [place]. Schedule an appointment as soon as possible for a visit in 4 week(s).   Specialty: Obstetrics and  Gynecology Why: Someone from clinic will call with appointment Contact information: [address], [place] [phone]                Message sent to [place] on 03/31/21: Please schedule this patient for a In person postpartum visit in 6 weeks with the following provider: Any provider. Additional Postpartum F/U: none   Low risk pregnancy complicated by:  n/a Delivery mode:  Vaginal, Spontaneous  Anticipated Birth Control:   none   04/02/2021 Hansel Feinstein, CNM

## 2021-04-01 ENCOUNTER — Encounter (HOSPITAL_COMMUNITY): Payer: Self-pay | Admitting: Obstetrics and Gynecology

## 2021-04-01 LAB — RPR: RPR Ser Ql: NONREACTIVE

## 2021-04-01 MED ORDER — IBUPROFEN 600 MG PO TABS
600.0000 mg | ORAL_TABLET | Freq: Four times a day (QID) | ORAL | Status: DC
  Administered 2021-04-01 – 2021-04-02 (×6): 600 mg via ORAL
  Filled 2021-04-01 (×5): qty 1

## 2021-04-01 MED ORDER — TETANUS-DIPHTH-ACELL PERTUSSIS 5-2.5-18.5 LF-MCG/0.5 IM SUSY: 0.5000 mL | PREFILLED_SYRINGE | Freq: Once | INTRAMUSCULAR | Status: DC

## 2021-04-01 MED ORDER — WITCH HAZEL-GLYCERIN EX PADS: 1.0000 "application " | MEDICATED_PAD | CUTANEOUS | Status: DC | PRN

## 2021-04-01 MED ORDER — COCONUT OIL OIL: 1.0000 "application " | TOPICAL_OIL | Status: DC | PRN

## 2021-04-01 MED ORDER — ACETAMINOPHEN 325 MG PO TABS
650.0000 mg | ORAL_TABLET | ORAL | Status: DC | PRN
  Filled 2021-04-01: qty 2

## 2021-04-01 MED ORDER — PRENATAL MULTIVITAMIN CH
1.0000 | ORAL_TABLET | Freq: Every day | ORAL | Status: DC
  Administered 2021-04-01 – 2021-04-02 (×2): 1 via ORAL
  Filled 2021-04-01 (×2): qty 1

## 2021-04-01 MED ORDER — ZOLPIDEM TARTRATE 5 MG PO TABS: 5.0000 mg | ORAL_TABLET | Freq: Every evening | ORAL | Status: DC | PRN

## 2021-04-01 MED ORDER — BENZOCAINE-MENTHOL 20-0.5 % EX AERO: 1.0000 "application " | INHALATION_SPRAY | CUTANEOUS | Status: DC | PRN

## 2021-04-01 MED ORDER — DIBUCAINE (PERIANAL) 1 % EX OINT: 1.0000 "application " | TOPICAL_OINTMENT | CUTANEOUS | Status: DC | PRN

## 2021-04-01 MED ORDER — ONDANSETRON HCL 4 MG/2ML IJ SOLN: 4.0000 mg | INTRAMUSCULAR | Status: DC | PRN

## 2021-04-01 MED ORDER — DIPHENHYDRAMINE HCL 25 MG PO CAPS: 25.0000 mg | ORAL_CAPSULE | Freq: Four times a day (QID) | ORAL | Status: DC | PRN

## 2021-04-01 MED ORDER — ONDANSETRON HCL 4 MG PO TABS: 4.0000 mg | ORAL_TABLET | ORAL | Status: DC | PRN

## 2021-04-01 MED ORDER — SIMETHICONE 80 MG PO CHEW: 80.0000 mg | CHEWABLE_TABLET | ORAL | Status: DC | PRN

## 2021-04-01 MED ORDER — SENNOSIDES-DOCUSATE SODIUM 8.6-50 MG PO TABS
2.0000 | ORAL_TABLET | Freq: Every day | ORAL | Status: DC
  Administered 2021-04-01 – 2021-04-02 (×2): 2 via ORAL
  Filled 2021-04-01 (×2): qty 2

## 2021-04-01 NOTE — Progress Notes (Signed)
Patient was a bit lethargic and was not communicating well with this nurse. Skin warm, dry, BP 127/82 at this time. Rechecked fundus and bleeding..no change in bleeding. Gave patient some water, cool cloth and a warm blanket. Talked with her until she became able to talk and communicate with this nurse. Sat up on side of bed... states no dizziness, no blurred vision, ...used steady to take patient to restroom. Once in restroom she voided well and began to look and feel better. Was able to stand and get into the wheel chair on her own.

## 2021-04-01 NOTE — Progress Notes (Addendum)
Patient ID: Toma Deiters, female   DOB: 1995/06/15, 26 y.o.   MRN: [id number]  POSTPARTUM PROGRESS NOTE  Post Partum Day #1  Subjective:  Meilah Glatfelter is a 26 y.o. [id number] s/p SVD at [redacted]w[redacted]d.  No acute events overnight.  Pt denies problems with ambulating, voiding or po intake.  She denies nausea or vomiting.  Reports having some cramping with breastfeeding.  She has passed flatus. She has not had bowel movement.  Lochia Small.   Objective: Blood pressure 122/65, pulse 90, temperature 98.5 F (36.9 C), temperature source Oral, resp. rate 16, last menstrual period 06/23/2020, SpO2 100 %, unknown if currently breastfeeding.  Physical Exam:  General: alert, cooperative and no distress Chest: no respiratory distress Heart:regular rate, distal pulses intact Abdomen: soft, non tender, normal bowel sounds Uterine Fundus: firm, appropriately tender DVT Evaluation: No calf swelling or tenderness Extremities: No edema Skin: warm, dry  Recent Labs    03/31/21 1913  HGB 10.7*  HCT 33.5*    Assessment/Plan: Yoceline Sexson is a 26 y.o. [id number] s/p SVD at [redacted]w[redacted]d.  PPD#1  - Doing well, having some cramping with breastfeeding, otherwise feels ok - continue Ibuprofen and Tylenol - Routine postpartum care - Planning for circumcision  Contraception: none Feeding: breast milk and formula Dispo: Plan for discharge tomorrow.   LOS: 1 day   Damita Dunnings, Medical Student 04/01/2021, 7:05 AM     GME ATTESTATION:  I saw and evaluated the patient. I agree with the findings and the plan of care as documented in the students note. Patient is still having some cramping with breastfeeding. Otherwise pain and bleeding appropriate.   Discussed circumcision for baby and consented during this conversation. Plan for DC on PPD#2  Warner Mccreedy, MD, MPH OB Fellow, Faculty Practice [facility], Center for [facility] 04/01/2021 1:02 PM

## 2021-04-01 NOTE — Lactation Note (Addendum)
This note was copied from a baby's chart. Lactation Consultation Note  Patient Name: Alison Mcbride [id number] Date: 04/01/2021 Reason for consult: Initial assessment;Term Age:26 hours, P2, female infant. Per mom, she feels breastfeeding is going well, infant recently breastfeed for 35 minutes at 2020 pm. LC did not observe latch at this time. Per mom, she already knows how to hand express and she has been latching infant on both breast during a feeding. Mom know to breastfeed infant according to hunger cues, 8 to 12+ or more times within 24 hours, skin to skin. LC discussed with mom at 24 hours of life infant may start cluster feeding and this is normal infant behavior. Mom doesn't have any questions or concerns for LC at this time. Mom made aware of O/P services, breastfeeding support groups, community resources, and our phone # for post-discharge questions.    Maternal Data Has patient been taught Hand Expression?: Yes Does the patient have breastfeeding experience prior to this delivery?: Yes How long did the patient breastfeed?: Per mom, she breastfeed her 1st child for 6 months, 1st child is currently 20 years old.  Feeding Mother's Current Feeding Choice: Breast Milk and Formula  LATCH Score                    Lactation Tools Discussed/Used    Interventions Interventions: Breast feeding basics reviewed;Skin to skin;Hand express;Position options;Breast compression;Education;LC Services brochure  Discharge    Consult Status Consult Status: Follow-up Date: 04/02/21 Follow-up type: In-patient    Danelle Earthly 04/01/2021, 9:15 PM

## 2021-04-01 NOTE — Clinical Social Work Maternal (Signed)
CLINICAL SOCIAL WORK MATERNAL/CHILD NOTE  Patient Details  Name: Alison Mcbride MRN: [id number] Date of Birth: 02/18/1996  Date:  04/01/2021  Clinical Social Worker Initiating Note:  Darra Lis, [place] Date/Time: Initiated:  04/01/21/0145     Child's Name:  Alison Mcbride   Biological Parents:  Father, Mother Alison Mcbride 09/18/1991)   Need for Interpreter:  None   Reason for Referral:  Current Substance Use/Substance Use During Pregnancy     Address:  [place] [address]    Phone number:  [phone] (home)     Additional phone number:   Household Members/Support Persons (HM/SP):   Household Member/Support Person 1, Household Member/Support Person 2   HM/SP Name Relationship DOB or Age  HM/SP -1 Alison Mcbride Comment Significant Other 09/18/1991  HM/SP -2 Alison Mcbride Son 09/30/2014  HM/SP -3        HM/SP -4        HM/SP -5        HM/SP -6        HM/SP -7        HM/SP -8          Natural Supports (not living in the home):  Immediate Family   Professional Supports: None   Employment: Full-time   Type of Work: [employer]   Education:      Homebound arranged:    Museum/gallery curator Resources:      Other Resources:      Cultural/Religious Considerations Which May Impact Care:    Strengths:  Ability to meet basic needs  , Home prepared for child  , Pediatrician chosen   Psychotropic Medications:         Pediatrician:    [place]  Pediatrician List:   [facility] [place]      Pediatrician Fax Number:    Risk Factors/Current Problems:  Substance Use     Cognitive State:  Linear Thinking  , Alert     Mood/Affect:  Interested  , Calm  , Bright     CSW Assessment: CSW consulted for anxiety, depression and THC use. CSW met with MOB to assess and offer support.   CSW introduced self and role. CSW observed FOB  performing skin to skin with infant. MOB declined to have FOB leave the room to complete assessment. CSW informed MOB of the reason for consult. MOB was pleasant and understanding. MOB reported that she lives with FOB and son. MOB is employed full time, receiving WIC and food stamp resources. MOB disclosed she was diagnosed with anxiety and depression in 2018. MOB shared that she had some general symptoms during pregnancy, but the symptoms were manageable. MOB reported she engaged in therapy while at [facility], which she found to be helpful. MOB has never been on medication to treat the symptoms. MOB stated she is currently doing well and has a positive support system. MOB denies any current SI or HI.   CSW inquired on MOB substance use. MOB reported she does not recall the last time she used substances, stating she did not use during the pregnancy. CSW informed MOB of the hospital drug screen policy. MOB aware infant UDS/CDS will be followed and a CPS report will be made if infant test positive. MOB was understanding and denies any previous CPS history.  CSW discussed PPD versus perinatal mood disorders.  MOB was provided the New Mom Checklist and mental health resources. MOB reported she feels comfortable contacting a medical professional if needs arise. CSW provided review of Sudden Infant Death Syndrome (SIDS) precautions.  MOB reported she has a crib, car seat and all essentials. MOB declined any additional community referrals at this time.   CSW will continue to follow UDS/CDS and make a CPS report if warranted. CSW identifies no further need for intervention and no barriers to discharge at this time.   CSW Plan/Description:  CSW Will Continue to Monitor Umbilical Cord Tissue Drug Screen Results and Make Report if [facility] [place], Child Protective Service Report  , Perinatal Mood and Anxiety Disorder (PMADs) Education, Sudden Infant Death Syndrome (SIDS) Education, No  Further Intervention Required/No Barriers to Discharge, Other Information/Referral to [employer], Other Patient/Family Education    Waylan Boga, [place] 04/01/2021, 2:01 PM

## 2021-04-02 MED ORDER — IBUPROFEN 600 MG PO TABS: 600.0000 mg | ORAL_TABLET | Freq: Four times a day (QID) | ORAL | 0 refills | Status: DC

## 2021-04-02 NOTE — Lactation Note (Signed)
This note was copied from a baby's chart. Lactation Consultation Note  Patient Name: Alison Mcbride [id number] Date: 04/02/2021 Reason for consult: Follow-up assessment;Term;Infant weight loss;Other (Comment) (baby latched with depth with swallows.per mom started feeding 12:10 pm. .) baby still feeding and per mom comfortable.  Age:26 hours LC reviewed BF D/C teaching.  Mom aware of the [facility] resources if needed.  Maternal Data    Feeding Mother's Current Feeding Choice: Breast Milk  LATCH Score Latch:  (latched with depth)  Audible Swallowing:  (swallows noted)     Comfort (Breast/Nipple):  (per mom comfortable)  Hold (Positioning):  (mom independent with latch.)      Lactation Tools Discussed/Used    Interventions Interventions: Breast feeding basics reviewed;Hand pump;Education;LC Services brochure  Discharge Discharge Education: Engorgement and breast care;Warning signs for feeding baby Pump: Personal;DEBP;Manual  Consult Status Consult Status: Complete Date: 04/02/21    Kathrin Greathouse 04/02/2021, 12:50 PM

## 2021-04-03 ENCOUNTER — Other Ambulatory Visit: Payer: Medicaid Other

## 2021-04-06 ENCOUNTER — Encounter: Payer: Medicaid Other | Admitting: Advanced Practice Midwife

## 2021-04-08 ENCOUNTER — Ambulatory Visit (HOSPITAL_BASED_OUTPATIENT_CLINIC_OR_DEPARTMENT_OTHER): Payer: Medicaid Other | Admitting: Obstetrics & Gynecology

## 2021-04-14 ENCOUNTER — Telehealth (HOSPITAL_COMMUNITY): Payer: Self-pay | Admitting: *Deleted

## 2021-04-14 DIAGNOSIS — Z1331 Encounter for screening for depression: Secondary | ICD-10-CM

## 2021-04-14 NOTE — Telephone Encounter (Signed)
Placed order for [place] based on EPDS score of 13 during Hospital Discharge Follow-Up Call.

## 2021-04-14 NOTE — Telephone Encounter (Signed)
Hospital Discharge Follow-Up Call:  Patient reports that physically she is doing well and has no concerns about her healing process.  EPDS today was 13, response to question 10 was 0.  She endorses that she is experiencing anxiety and that she is not coping as well as she usually does.   Patient says that baby is well and she has no concerns about baby's health.  She reports that baby sleeps in a bassinet and also in a crib sometimes.  Reviewed ABCs of Safe Sleep.

## 2021-04-28 ENCOUNTER — Encounter (HOSPITAL_BASED_OUTPATIENT_CLINIC_OR_DEPARTMENT_OTHER): Payer: Self-pay | Admitting: Obstetrics & Gynecology

## 2021-05-05 ENCOUNTER — Telehealth: Payer: Self-pay | Admitting: *Deleted

## 2021-05-05 NOTE — Telephone Encounter (Signed)
Cathy from [facility] called to let us know that Alison Mcbride scored a 17 on the [place] screening that was done today at a home visit. LMOVM for pt to call office regarding this.

## 2021-05-18 ENCOUNTER — Other Ambulatory Visit: Payer: Self-pay

## 2021-05-18 ENCOUNTER — Ambulatory Visit (INDEPENDENT_AMBULATORY_CARE_PROVIDER_SITE_OTHER): Payer: Medicaid Other | Admitting: Obstetrics & Gynecology

## 2021-05-18 ENCOUNTER — Encounter (HOSPITAL_BASED_OUTPATIENT_CLINIC_OR_DEPARTMENT_OTHER): Payer: Self-pay | Admitting: Obstetrics & Gynecology

## 2021-05-18 ENCOUNTER — Ambulatory Visit: Payer: Medicaid Other | Admitting: Advanced Practice Midwife

## 2021-05-18 NOTE — Progress Notes (Signed)
? ? ?Post Partum Visit Note ? ?Alison Mcbride is a 26 y.o. [phone] female who presents for a postpartum visit. She is 6 weeks postpartum following a normal spontaneous vaginal delivery.  I have fully reviewed the prenatal and intrapartum course. The delivery was at 40 1/7 gestational weeks.  Anesthesia: none. Postpartum course has been normal. Baby is doing well . Baby is feeding by breast and bottle. Bleeding no bleeding. Bowel function is normal. Bladder function is normal. Patient is not sexually active. Contraception method is none. She initially considered BTL.  Does not want any hormonal birth control.  Not interested in IUD.  Postpartum depression screening: positive.  Pt had positive testing throughout pregnancy and continues to decline treatment.  She is heading to [place] today with sister for "girls trip".   ? ? ?The pregnancy intention screening data noted above was reviewed. Potential methods of contraception were discussed. The patient elected to proceed with nothing at this time. ? ? ? ?Health Maintenance Due  ?Topic Date Due  ? COVID-19 Vaccine (3 - Booster for Pfizer series) 08/26/2019  ? ? ?The following portions of the patient's history were reviewed and updated as appropriate: allergies, current medications, past family history, past medical history, past social history, past surgical history, and problem list. ? ?Review of Systems ?A comprehensive review of systems was negative. ? ?Objective:  ?BP 112/85 (BP Location: Right Arm, Patient Position: Sitting, Cuff Size: Large)   Ht 5\' 4"  (1.626 m) Comment: reported  Wt 187 lb 9.6 oz (85.1 kg)   BMI 32.20 kg/m?   ? ?General:  alert, cooperative, and no distress  ? Breasts:  normal  ?Lungs: clear to auscultation bilaterally  ?Heart:  regular rate and rhythm, S1, S2 normal, no murmur, click, rub or gallop  ?Abdomen: soft, non-tender; bowel sounds normal; no masses,  no organomegaly   ?Wound  N/a  ?GU exam:  normal  ?     ?Assessment:  ? ? There are  no diagnoses linked to this encounter. ? ?6 weeks postpartum exam.  ? ?Plan:  ? ?Essential components of care per ACOG recommendations: ? ?1.  Mood and well being: Patient with negative depression screening today. Reviewed local resources for support.  ?- Patient tobacco use? No.   ?- hx of drug use? No.   ? ?2. Infant care and feeding:  ?-Patient currently breastmilk feeding? Breast/bottle ?-Social determinants of health (SDOH) reviewed in EPIC.  ? ?3. Sexuality, contraception and birth spacing ?- Patient is unsure about future pregnancies.  She is not sure of ideal family size.   ?- Reviewed reproductive life planning. Reviewed contraceptive methods based on pt preferences and effectiveness.  Patient desired no method today.   ?- Discussed birth spacing of 18 months ? ?4. Sleep and fatigue ?-Encouraged family/partner/community support of 4 hrs of uninterrupted sleep to help with mood and fatigue ? ?5. Physical Recovery  ?- Discussed patients delivery and complications. She describes her labor as good. ?- Patient had a Vaginal, no problems at delivery. Patient had no laceration. Perineal healing reviewed. Patient expressed understanding ?- Patient has urinary incontinence? No. ?- Patient is safe to resume physical and sexual activity ? ?6.  Health Maintenance ?- HM due items addressed Yes ?- Last pap smear 05/2019.  Needs repeated next year. ?Diagnosis  ?Date Value Ref Range Status  ?06/12/2019   Final  ? - Negative for intraepithelial lesion or malignancy (NILM)  ? Pap smear not done at today's visit.  ?-Breast Cancer screening  indicated? No.  ? ?7. Chronic Disease/Pregnancy Condition follow up:  asthma ? ?- PCP follow up ? ?Jerene Bears, MD ?Center for [facility], [facility] ? ?

## 2021-09-08 ENCOUNTER — Encounter (HOSPITAL_BASED_OUTPATIENT_CLINIC_OR_DEPARTMENT_OTHER): Payer: Self-pay | Admitting: Obstetrics & Gynecology

## 2021-10-13 ENCOUNTER — Ambulatory Visit (INDEPENDENT_AMBULATORY_CARE_PROVIDER_SITE_OTHER): Payer: Medicaid Other | Admitting: Obstetrics & Gynecology

## 2021-10-13 VITALS — BP 134/90 | HR 74 | Ht 64.0 in | Wt 181.0 lb

## 2021-10-13 DIAGNOSIS — F53 Postpartum depression: Secondary | ICD-10-CM | POA: Diagnosis not present

## 2021-10-13 DIAGNOSIS — F322 Major depressive disorder, single episode, severe without psychotic features: Secondary | ICD-10-CM | POA: Diagnosis not present

## 2021-10-13 MED ORDER — BUPROPION HCL ER (XL) 150 MG PO TB24: 150.0000 mg | ORAL_TABLET | Freq: Every day | ORAL | 1 refills | Status: DC

## 2021-10-14 ENCOUNTER — Encounter (HOSPITAL_BASED_OUTPATIENT_CLINIC_OR_DEPARTMENT_OTHER): Payer: Self-pay | Admitting: Obstetrics & Gynecology

## 2021-10-14 NOTE — Progress Notes (Signed)
GYNECOLOGY  VISIT  CC:   depressed mood  HPI: 26 y.o. G29P2002 Single Black or African American female here for complaint of depressed mood that is gradually worsening.  Had baby in January.  Doing really well.  FOB involved.  Family is supportive.  She is struggling to find joy in every day life.  Wanting to be away from friend and family.  Has resisted asking for help or medications.  Does NOT want to gain weight on medications but willing to start treatment.  Depression inventory completed today.  Does not have any plans for self harm  Treatment options discussed.  Due to weight gain concerns, feel Wellbutrin would be good medication to start with right now.  Therapy also discussed.  She is wiling to start this as well--anything to feel better.   Edinburgh Postnatal Depression Scale - 10/13/21 1406       Edinburgh Postnatal Depression Scale:  In the Past 7 Days   I have been able to laugh and see the funny side of things. 1    I have looked forward with enjoyment to things. 2    I have blamed myself unnecessarily when things went wrong. 3    I have been anxious or worried for no good reason. 3    I have felt scared or panicky for no good reason. 3    Things have been getting on top of me. 2    I have been so unhappy that I have had difficulty sleeping. 2    I have felt sad or miserable. 2    I have been so unhappy that I have been crying. 3    The thought of harming myself has occurred to me. 0    Edinburgh Postnatal Depression Scale Total 21               Past Medical History:  Diagnosis Date   Anxiety    Asthma    Seasonal allergies     MEDS:   Current Outpatient Medications on File Prior to Visit  Medication Sig Dispense Refill   albuterol (PROVENTIL HFA;VENTOLIN HFA) 108 (90 Base) MCG/ACT inhaler Inhale 1-2 puffs into the lungs every 6 (six) hours as needed for wheezing or shortness of breath. 1 Inhaler 0   Multiple Vitamins-Minerals (WOMENS MULTIVITAMIN PO) Take by  mouth.     No current facility-administered medications on file prior to visit.    ALLERGIES: Patient has no known allergies.  SH:  single, non smoker  Review of Systems  Psychiatric/Behavioral:  Positive for depression.     PHYSICAL EXAMINATION:    BP (!) 134/90   Pulse 74   Ht 5\' 4"  (1.626 m)   Wt 181 lb (82.1 kg)   LMP 09/24/2021 (Exact Date)   Breastfeeding No   BMI 31.07 kg/m     General appearance: alert, cooperative and appears stated age  Assessment/Plan: 1. Postpartum depression - Ambulatory referral to Integrated Behavioral Health - buPROPion (WELLBUTRIN XL) 150 MG 24 hr tablet; Take 1 tablet (150 mg total) by mouth daily.  Dispense: 30 tablet; Refill: 1 - plan recheck 1 month

## 2021-10-20 NOTE — BH Specialist Note (Deleted)
Pt will reschedule tomorrow; [place]

## 2021-11-02 ENCOUNTER — Encounter: Payer: Medicaid Other | Admitting: Clinical

## 2021-11-23 ENCOUNTER — Encounter (HOSPITAL_BASED_OUTPATIENT_CLINIC_OR_DEPARTMENT_OTHER): Payer: Self-pay | Admitting: Obstetrics & Gynecology

## 2021-11-23 ENCOUNTER — Ambulatory Visit (HOSPITAL_BASED_OUTPATIENT_CLINIC_OR_DEPARTMENT_OTHER): Payer: Medicaid Other | Admitting: Obstetrics & Gynecology

## 2021-11-23 VITALS — BP 135/96 | HR 69 | Ht 64.0 in | Wt 180.4 lb

## 2021-11-23 DIAGNOSIS — Z3049 Encounter for surveillance of other contraceptives: Secondary | ICD-10-CM

## 2021-11-23 DIAGNOSIS — F53 Postpartum depression: Secondary | ICD-10-CM

## 2021-11-23 MED ORDER — BUPROPION HCL ER (XL) 150 MG PO TB24: 150.0000 mg | ORAL_TABLET | Freq: Every day | ORAL | 3 refills | Status: AC

## 2021-11-23 NOTE — Progress Notes (Signed)
GYNECOLOGY  VISIT  CC:   recheck after starting Wellbutrin  HPI: 26 y.o. G102P2002 Single Black or African American female here for follow up after starting Wellbutrin 150mg  XL daily.  Reports it took about 10 days for her to start feeling better.  Now she is feeling more like her "normal" self.  Also reports has been able to have change in housing which has been good.  Feeling like she coping better with life stressors.  Son is doing well.  Only side effect has been nausea a couple of times.  Took Tums and this resolved symptoms.  Suggested taking at night if becomes more frequent.    Did not yet have appt with but is scheduled this week.  She is planning on keeping appt.  PHQ2 today:  0.  Flowsheet Row Office Visit from 11/23/2021 in [facility]  PHQ-2 Total Score 0        Past Medical History:  Diagnosis Date   Anxiety    Asthma    Seasonal allergies     MEDS:   Current Outpatient Medications on File Prior to Visit  Medication Sig Dispense Refill   albuterol (PROVENTIL HFA;VENTOLIN HFA) 108 (90 Base) MCG/ACT inhaler Inhale 1-2 puffs into the lungs every 6 (six) hours as needed for wheezing or shortness of breath. 1 Inhaler 0   buPROPion (WELLBUTRIN XL) 150 MG 24 hr tablet Take 1 tablet (150 mg total) by mouth daily. 30 tablet 1   Multiple Vitamins-Minerals (WOMENS MULTIVITAMIN PO) Take by mouth.     No current facility-administered medications on file prior to visit.    ALLERGIES: Patient has no known allergies.  SH:  has partner, non smoker  Review of Systems  Constitutional: Negative.   Psychiatric/Behavioral: Negative.      PHYSICAL EXAMINATION:    BP (!) 135/96 (BP Location: Left Arm, Patient Position: Sitting, Cuff Size: Large)   Pulse 69   Ht 5\' 4"  (1.626 m) Comment: reported  Wt 180 lb 6.4 oz (81.8 kg)   LMP 11/21/2021 (Approximate)   BMI 30.97 kg/m     General appearance: alert, cooperative and appears stated  age  Assessment/Plan: 1. Postpartum depression - doing much better.  Will stay on same dosage.  Advised to not stop for at least 6 months and if decides to at that time to let me know.  Will have her wean off this.   - pt planning on keeping appt with [facility] this week. - Needs AEX in 05/2022 or later for pap smear - buPROPion (WELLBUTRIN XL) 150 MG 24 hr tablet; Take 1 tablet (150 mg total) by mouth daily.  Dispense: 90 tablet; Refill: 3  2.  Contraception - pt using withdrawal method.  Does not desire anything else at this time.  She is taking regular vitamin as well.

## 2021-11-23 NOTE — BH Specialist Note (Unsigned)
Integrated Behavioral Health via Telemedicine Visit  12/01/2021 Alison Mcbride [id number]  Number of Integrated Behavioral Health Clinician visits: No data recorded Session Start time: No data recorded  Session End time: No data recorded Total time in minutes: No data recorded  Referring Provider: Valentina Shaggy, MD Patient/Family location: Home [facility] Provider location: Center for [facility]  All persons participating in visit: Patient Alison Mcbride and [facility] Alison Mcbride   Types of Service: Individual psychotherapy and Video visit  I connected with Alison Mcbride and/or Alison Mcbride's  n/a  via  Telephone or Engineer, civil (consulting)  (Video is Surveyor, mining) and verified that I am speaking with the correct person using two identifiers. Discussed confidentiality: Yes   I discussed the limitations of telemedicine and the availability of in person appointments.  Discussed there is a possibility of technology failure and discussed alternative modes of communication if that failure occurs.  I discussed that engaging in this telemedicine visit, they consent to the provision of behavioral healthcare and the services will be billed under their insurance.  Patient and/or legal guardian expressed understanding and consented to Telemedicine visit: Yes   Presenting Concerns: Patient and/or family reports the following symptoms/concerns: Depression and anxiety improving after starting Wellbutrin, mild work stress; requests self-coping strategy to help with motivation and managing anxiety in the future.  Duration of problem: Ongoing with increase postpartum; Severity of problem: moderate  Patient and/or Family's Strengths/Protective Factors: Social connections, Concrete supports in place (healthy food, safe environments, etc.), Sense of purpose, and Physical Health (exercise, healthy diet, medication compliance, etc.)  Goals  Addressed: Patient will:  Maintain reduction of symptoms of: anxiety and depression   Increase knowledge and/or ability of: self-management skills   Demonstrate ability to: Increase motivation to adhere to plan of care  Progress towards Goals: Ongoing  Interventions: Interventions utilized:  CBT Cognitive Behavioral Therapy Standardized Assessments completed: GAD-7 and PHQ 9  Patient and/or Family Response: Patient agrees with treatment plan.   Assessment: Patient currently experiencing Major depressive disorder, remission unspecified  Patient may benefit from psychoeducation and brief therapeutic interventions regarding coping with symptoms of depression and anxiety .  Plan: Follow up with behavioral health clinician on : Call Valetta Mulroy at [phone], as needed. Behavioral recommendations:  -Continue taking Wellbutrin as prescribed -Begin Worry Time strategy, as discussed. Start by setting up start and end time reminders on phone today; continue daily for as long as remains helpful -Continue using sleep sounds at night and relaxation breathing as needed  Referral(s): Integrated [employer] (In Clinic)  I discussed the assessment and treatment plan with the patient and/or parent/guardian. They were provided an opportunity to ask questions and all were answered. They agreed with the plan and demonstrated an understanding of the instructions.   They were advised to call back or seek an in-person evaluation if the symptoms worsen or if the condition fails to improve as anticipated.  Valetta Close Lyfe Monger, LCSW     12/01/2021    1:25 PM 11/23/2021   11:29 AM 11/23/2021   11:19 AM 05/18/2021    9:23 AM 03/05/2021   11:21 AM  Depression screen PHQ 2/9  Decreased Interest 3 0 0 0 1  Down, Depressed, Hopeless 0 0 0 0 2  PHQ - 2 Score 3 0 0 0 3  Altered sleeping 1    2  Tired, decreased energy 1    1  Change in appetite 0    0  Feeling bad or failure about yourself  0     1  Trouble concentrating 0    1  Moving slowly or fidgety/restless 0    1  Suicidal thoughts 0    0  PHQ-9 Score 5    9      12/01/2021    1:26 PM 03/05/2021   11:21 AM 01/14/2021   11:33 AM 08/21/2020    9:08 AM  GAD 7 : Generalized Anxiety Score  Nervous, Anxious, on Edge 1 3 2 2   Control/stop worrying 0 3 2 2   Worry too much - different things 0 3 2 2   Trouble relaxing 0 1 3 2   Restless 0 0 2 1  Easily annoyed or irritable 1 2 2 2   Afraid - awful might happen 0 1 2 0  Total GAD 7 Score 2 13 15  11

## 2021-12-01 ENCOUNTER — Ambulatory Visit: Payer: Medicaid Other | Admitting: Clinical

## 2021-12-01 DIAGNOSIS — F339 Major depressive disorder, recurrent, unspecified: Secondary | ICD-10-CM

## 2021-12-01 NOTE — Patient Instructions (Signed)
[facility] [address] [place], [place] [address] [phone] (main office) [phone] (Clearence Vitug's office)   

## 2022-01-04 ENCOUNTER — Encounter: Payer: Self-pay | Admitting: *Deleted

## 2022-05-25 ENCOUNTER — Encounter: Payer: Self-pay | Admitting: Family Medicine

## 2022-05-25 ENCOUNTER — Ambulatory Visit: Payer: Medicaid Other | Admitting: Family Medicine

## 2022-05-25 VITALS — BP 120/75 | HR 75 | Ht 64.0 in | Wt 191.2 lb

## 2022-05-25 DIAGNOSIS — E669 Obesity, unspecified: Secondary | ICD-10-CM | POA: Diagnosis not present

## 2022-05-25 DIAGNOSIS — J4520 Mild intermittent asthma, uncomplicated: Secondary | ICD-10-CM | POA: Diagnosis not present

## 2022-05-25 DIAGNOSIS — Z0000 Encounter for general adult medical examination without abnormal findings: Secondary | ICD-10-CM

## 2022-05-25 DIAGNOSIS — R5383 Other fatigue: Secondary | ICD-10-CM | POA: Diagnosis present

## 2022-05-25 MED ORDER — MONTELUKAST SODIUM 10 MG PO TABS: 10.0000 mg | ORAL_TABLET | Freq: Every day | ORAL | 3 refills | Status: AC

## 2022-05-25 NOTE — Assessment & Plan Note (Signed)
Patient has long history of difficulty with weight loss.  Though, she is lost weight since she was 300 pounds years ago. - Counseled on lifestyle interventions and barriers - Goals of increasing workouts by 2 20-minute workouts per week, tracking food, improving sleep quality by changing bupropion to taking it in the morning and reducing screen time at night - Will follow-up TSH - Follow-up in 3 weeks

## 2022-05-25 NOTE — Assessment & Plan Note (Signed)
Patient has chronic generalized fatigue no other alarm symptoms.  Has menorrhagia with history of iron deficiency anemia, and poor sleep quality.  Fatigue is more likely due to poor sleep quality due to multiple life stressors. - Counseled regarding sleep hygiene - Optimize sleep by taking bupropion in the morning - CBC, TSH

## 2022-05-25 NOTE — Patient Instructions (Signed)
It was wonderful to see you today.  Please bring ALL of your medications with you to every visit.   Today we talked about:  Weight loss - Our goals are for you to track your food intake for 2 weeks, add 2 home workouts per week, and work on your sleep.   Sleep - Try taking your buproprion in the morning, limiting screens at night   Asthma - I am going to start you on singulair   Please follow up in 2 weeks   Thank you for choosing [facility].   Please call [phone] with any questions about today's appointment.  Please be sure to schedule follow up at the front desk before you leave today.   Lowry Ram, MD  Family Medicine

## 2022-05-25 NOTE — Assessment & Plan Note (Signed)
Patient has had to use her albuterol more often especially during the season due to allergies.  She is also had to use it more often when she is sick.  She has been getting URI symptoms more easily. -Start Singulair - Continue albuterol as needed - Follow-up in 3 weeks

## 2022-05-25 NOTE — Progress Notes (Cosign Needed Addendum)
New Patient Visit  HPI:  Patient presents today for a new patient appointment to establish general primary care.  Prior PCP: Long ago, last seen when she was 27 yo, was seeing mostly OB in between   Other care team members: OB   Concerns today:  Weight -patient reports that she has always struggled with her weight.  She says that she used to be 300 pounds long time ago and got down to 170 now she has been exercising and eating healthy but she says that she still struggles with losing weight.  States that she eats 2-3 meals a day and watches her calories she has tried intermittent fasting, dieting, calorie counting.  Says that she goes to [employer] once a week each and works out otherwise.  She says that she is very active. Asthma - Has only used albuterol inhaler/neb in the past, uses them seasonally, only uses OTC allergy medicine as needed.  Patient states that she has been having to use her albuterol inhaler a lot more often recently.  She says that this is the season when she has to use it more often.  However she is also had to use it more often when she gets sick.  She would use it multiple times a day for a week when she is sick allergies really trigger her symptoms. Fatigue -patient reports that she is much more fatigued the last couple months.  She says that she is tired during the day and comes home from work very tired unable to do her usual tasks.  Denies falling asleep while driving or falling asleep while talking to people.  Says that her sleep is very poor.  Says that she wakes up in the middle of the night and is unable to go back to sleep.  Says that she takes her bupropion at night as this is what was told to her by a different provider.  He says that she tries to limit screen time at night already.  She uses soothing sounds with Alexa to fall asleep.  States that she still has regular periods and has pretty heavy bleeding with her periods.  Says she does have a  history of anemia and took iron supplementation previously.  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 - highest level of education: highschool  - lives with: two sons  - tobacco: none - alcohol: glass of wine a day  - drugs: none   Health Maintenance:  -due for pap smear this month   PHYSICAL EXAM: BP 120/75   Pulse 75   Ht '5\' 4"'$  (1.626 m)   Wt 191 lb 3.2 oz (86.7 kg)   LMP 05/12/2022 (Approximate)   SpO2 100%   BMI 32.82 kg/m  Gen: Well-appearing, no distress HEENT: Mucous membranes moist Heart: Well-perfused Lungs: Normal work of breathing on room air Abdomen: Soft, nondistended, nontender Neuro: Alert and oriented Derm: No acanthosis nigricans  ASSESSMENT/PLAN:  Health maintenance:  -Will schedule her Pap with her OB  Annual Physical  - Lipid panel  - CMP  Obesity (BMI 30.0-34.9) Patient has long history of difficulty with weight loss.  Though, she is lost weight since she was 300 pounds years ago. - Counseled on lifestyle interventions  and barriers - Goals of increasing workouts by 2 20-minute workouts per week, tracking food, improving sleep quality by changing bupropion to taking it in the morning and reducing screen time at night - Will follow-up TSH - Follow-up in 3 weeks  Fatigue Patient has chronic generalized fatigue no other alarm symptoms.  Has menorrhagia with history of iron deficiency anemia, and poor sleep quality.  Fatigue is more likely due to poor sleep quality due to multiple life stressors. - Counseled regarding sleep hygiene - Optimize sleep by taking bupropion in the morning - CBC, TSH  Asthma Patient has had to use her albuterol more often especially during the season due to allergies.  She is also had to use it more often when she is  sick.  She has been getting URI symptoms more easily. -Start Singulair - Continue albuterol as needed - Follow-up in 3 weeks     FOLLOW UP: Follow up in 3 weeks  Lowry Ram, MD [place]

## 2022-05-25 NOTE — Addendum Note (Signed)
Addended by: Lowry Ram on: 05/25/2022 04:52 PM   Modules accepted: Level of Service

## 2022-05-26 LAB — LIPID PANEL
Chol/HDL Ratio: 1.9 ratio (ref 0.0–4.4)
Cholesterol, Total: 147 mg/dL (ref 100–199)
HDL: 77 mg/dL (ref >39)
LDL Chol Calc (NIH): 56 mg/dL (ref 0–99)
Triglycerides: 72 mg/dL (ref 0–149)
VLDL Cholesterol Cal: 14 mg/dL (ref 5–40)

## 2022-05-26 LAB — COMPREHENSIVE METABOLIC PANEL
ALT: 11 IU/L (ref 0–32)
AST: 18 IU/L (ref 0–40)
Albumin/Globulin Ratio: 1.5 (ref 1.2–2.2)
Albumin: 4 g/dL (ref 4.0–5.0)
Alkaline Phosphatase: 51 IU/L (ref 44–121)
BUN/Creatinine Ratio: 11 (ref 9–23)
BUN: 8 mg/dL (ref 6–20)
Bilirubin Total: 0.3 mg/dL (ref 0.0–1.2)
CO2: 23 mmol/L (ref 20–29)
Calcium: 9.1 mg/dL (ref 8.7–10.2)
Chloride: 100 mmol/L (ref 96–106)
Creatinine, Ser: 0.71 mg/dL (ref 0.57–1.00)
Globulin, Total: 2.6 g/dL (ref 1.5–4.5)
Glucose: 86 mg/dL (ref 70–99)
Potassium: 4.3 mmol/L (ref 3.5–5.2)
Sodium: 137 mmol/L (ref 134–144)
Total Protein: 6.6 g/dL (ref 6.0–8.5)
eGFR: 120 mL/min/{1.73_m2} (ref >59)

## 2022-05-26 LAB — CBC WITH DIFFERENTIAL/PLATELET
Basophils Absolute: 0 10*3/uL (ref 0.0–0.2)
Basos: 1 %
EOS (ABSOLUTE): 0.1 10*3/uL (ref 0.0–0.4)
Eos: 4 %
Hematocrit: 31.5 % — ABNORMAL LOW (ref 34.0–46.6)
Hemoglobin: 9.6 g/dL — ABNORMAL LOW (ref 11.1–15.9)
Immature Grans (Abs): 0 10*3/uL (ref 0.0–0.1)
Immature Granulocytes: 0 %
Lymphocytes Absolute: 1.9 10*3/uL (ref 0.7–3.1)
Lymphs: 48 %
MCH: 24.5 pg — ABNORMAL LOW (ref 26.6–33.0)
MCHC: 30.5 g/dL — ABNORMAL LOW (ref 31.5–35.7)
MCV: 80 fL (ref 79–97)
Monocytes Absolute: 0.3 10*3/uL (ref 0.1–0.9)
Monocytes: 7 %
Neutrophils Absolute: 1.6 10*3/uL (ref 1.4–7.0)
Neutrophils: 40 %
Platelets: 430 10*3/uL (ref 150–450)
RBC: 3.92 x10E6/uL (ref 3.77–5.28)
RDW: 15.4 % (ref 11.7–15.4)
WBC: 3.9 10*3/uL (ref 3.4–10.8)

## 2022-05-26 LAB — TSH RFX ON ABNORMAL TO FREE T4: TSH: 0.468 u[IU]/mL (ref 0.450–4.500)

## 2022-06-03 ENCOUNTER — Ambulatory Visit (HOSPITAL_BASED_OUTPATIENT_CLINIC_OR_DEPARTMENT_OTHER): Payer: Medicaid Other | Admitting: Obstetrics & Gynecology

## 2022-06-10 ENCOUNTER — Ambulatory Visit: Payer: Self-pay | Admitting: Family Medicine

## 2022-06-17 ENCOUNTER — Encounter: Payer: Self-pay | Admitting: Family Medicine

## 2022-06-17 ENCOUNTER — Ambulatory Visit: Payer: Medicaid Other | Admitting: Family Medicine

## 2022-06-17 VITALS — BP 108/64 | HR 76 | Ht 64.0 in | Wt 183.4 lb

## 2022-06-17 DIAGNOSIS — J302 Other seasonal allergic rhinitis: Secondary | ICD-10-CM

## 2022-06-17 DIAGNOSIS — E669 Obesity, unspecified: Secondary | ICD-10-CM

## 2022-06-17 DIAGNOSIS — D509 Iron deficiency anemia, unspecified: Secondary | ICD-10-CM | POA: Diagnosis present

## 2022-06-17 LAB — POCT GLYCOSYLATED HEMOGLOBIN (HGB A1C): Hemoglobin A1C: 5.1 % (ref 4.0–5.6)

## 2022-06-17 MED ORDER — SEMAGLUTIDE-WEIGHT MANAGEMENT 0.25 MG/0.5ML ~~LOC~~ SOAJ: 0.2500 mg | SUBCUTANEOUS | 0 refills | Status: DC

## 2022-06-17 MED ORDER — FERROUS SULFATE 325 (65 FE) MG PO TABS: 325.0000 mg | ORAL_TABLET | ORAL | 3 refills | Status: AC

## 2022-06-17 MED ORDER — FLUTICASONE PROPIONATE 50 MCG/ACT NA SUSP: 2.0000 | Freq: Every day | NASAL | 6 refills | Status: AC

## 2022-06-17 MED ORDER — CETIRIZINE HCL 10 MG PO TABS: 10.0000 mg | ORAL_TABLET | Freq: Every day | ORAL | 11 refills | Status: AC

## 2022-06-17 NOTE — Progress Notes (Signed)
    SUBJECTIVE:   CHIEF COMPLAINT / HPI:   Patient presents to follow up on obesity, fatigue, asthma   Fatigue: Gets 4-5 hours of sleep a night. Days start at 6am. Goes to bed at 11:30. Has tried to go to bed earlier but wakes up in the middle of the night and can't go back to sleep. Has 27 year old as well. Does think she snores, hasn't been told she stops breathing. Understands hemoglobin is low and that her anemia also could be contributing   Feels she has delt with anxiety but feels its beter controlled Was taking Wellbutrin in the past but feels its better controlled   Starting to feel her allergies. Only taking Benadryl and albuterol prn.   Weight- has been tracking her calories, started at [employer]- goes once a week and started last month Feels diet is pretty good- cooks her own food. Eats breakfast, lunch and dinner. Meals aren't heavy- salads, wraps. Vegetables with every meal.  Has lost weight in the past but has been hard after having her baby   PERTINENT  PMH / PSH: Reviewed   OBJECTIVE:   BP 108/64   Pulse 76   Ht 5\' 4"  (1.626 m)   Wt 183 lb 6.4 oz (83.2 kg)   LMP 06/05/2022 (Approximate)   SpO2 97%   BMI 31.48 kg/m    Physical exam General: well appearing, NAD Cardiovascular: RRR, no murmurs Lungs: CTAB. Normal WOB Abdomen: soft, non-distended Skin: warm, dry. No edema  ASSESSMENT/PLAN:   Obesity (BMI 30.0-34.9) BMI 31.48. A1c 5.1. Discussed lifestyle changes in detail. Patient would benefit from GLP-1 to assist in weight loss if able to get it approved. Also recommended [facility]. - Semaglutide .25mg  weekly  - advised to schedule f/u if she is able to get medication   Seasonal allergies - Prescribed Zyrtec and Flonase daily   Anemia Hgb 9.6. Not currently taking iron. Likely contributing to her fatigue. Will start po iron qod. F/u in 4-6 weeks to recheck.  - ferrous sulfate 325 qod   Cora Collum,  DO [facility]

## 2022-06-17 NOTE — Patient Instructions (Addendum)
It was great seeing you today!  We discussed your fatigue and from your last blood work your hemoglobin was low so I recommend starting iron every other day  For your allergies I recommend doing Flonase and Zyrtec daily which I have sent to your pharmacy  Your A1c today was normal at 5.1. Im going to try and see if insurance will approve the once weekly injection for weight loss. If they do approve it, before you start the medicine I would like see you in the clinic. Also for weight loss I recommend reaching out to the [facility] to get on their wait list: [phone]. Continue healthy eating and exercise (try starting to increasing frequency- recommend 150 minutes of moderate intensity exercise weekly)  Please check-out at the front desk before leaving the clinic. I'd like to see you back in about a month to follow up, but if you need to be seen earlier than that for any new issues we're happy to fit you in, just give [place] a call!  Visit Reminders: - Stop by the pharmacy to pick up your prescriptions - Continue to work on your healthy eating habits and incorporating exercise into your daily life.   Feel free to call with any questions or concerns at any time, at [phone].   Take care,  Dr. Shary [facility]

## 2022-06-18 DIAGNOSIS — D649 Anemia, unspecified: Secondary | ICD-10-CM | POA: Insufficient documentation

## 2022-06-18 NOTE — Assessment & Plan Note (Signed)
Hgb 9.6. Not currently taking iron. Likely contributing to her fatigue. Will start po iron qod. F/u in 4-6 weeks to recheck.  - ferrous sulfate 325 qod

## 2022-06-18 NOTE — Assessment & Plan Note (Signed)
-   Prescribed Zyrtec and Flonase daily

## 2022-06-18 NOTE — Assessment & Plan Note (Signed)
BMI 31.48. A1c 5.1. Discussed lifestyle changes in detail. Patient would benefit from GLP-1 to assist in weight loss if able to get it approved. Also recommended contacting [facility]. - Semaglutide .25mg  weekly  - advised to schedule f/u if she is able to get medication

## 2022-06-24 ENCOUNTER — Telehealth: Payer: Self-pay

## 2022-06-24 NOTE — Telephone Encounter (Signed)
Rec'd fax from patients pharmacy regarding PA for patients Ozempic.   Pt doesn't have dx of diabetes. Medications not covered for weight loss/ obesity.

## 2022-06-29 ENCOUNTER — Other Ambulatory Visit: Payer: Self-pay | Admitting: Family Medicine

## 2022-07-20 ENCOUNTER — Other Ambulatory Visit: Payer: Self-pay

## 2022-07-20 ENCOUNTER — Ambulatory Visit (INDEPENDENT_AMBULATORY_CARE_PROVIDER_SITE_OTHER): Payer: Medicaid Other | Admitting: Family Medicine

## 2022-07-20 ENCOUNTER — Other Ambulatory Visit (HOSPITAL_COMMUNITY)
Admission: RE | Admit: 2022-07-20 | Discharge: 2022-07-20 | Disposition: A | Discharge status: Home / Self Care | Payer: Medicaid Other | Source: Ambulatory Visit | Attending: Family Medicine | Admitting: Family Medicine

## 2022-07-20 ENCOUNTER — Encounter: Payer: Self-pay | Admitting: Family Medicine

## 2022-07-20 VITALS — BP 112/72 | HR 71 | Ht 64.0 in | Wt 189.2 lb

## 2022-07-20 DIAGNOSIS — Z113 Encounter for screening for infections with a predominantly sexual mode of transmission: Secondary | ICD-10-CM

## 2022-07-20 DIAGNOSIS — E66811 Obesity, class 1: Secondary | ICD-10-CM

## 2022-07-20 DIAGNOSIS — Z124 Encounter for screening for malignant neoplasm of cervix: Secondary | ICD-10-CM | POA: Diagnosis not present

## 2022-07-20 DIAGNOSIS — E669 Obesity, unspecified: Secondary | ICD-10-CM

## 2022-07-20 NOTE — Assessment & Plan Note (Signed)
Completed Pap smear. Pelvic exam with moderate amount of thin clear discharge. Sexually active and will check STIs, HIV, RPR. Declines contraception.

## 2022-07-20 NOTE — Assessment & Plan Note (Signed)
BMI 32.48. Will submit prior auth for wegovy as it was not initially approved by insurance. Continue healthy eating and exercise. Recommended also reaching out to [facility]. Will get insulin levels to assess for insulin resistance.

## 2022-07-20 NOTE — Progress Notes (Signed)
    SUBJECTIVE:   CHIEF COMPLAINT / HPI:   Patient presents for follow up from visit on 4/4. Discussed weight loss and tried to start her on Wegovy but she states she is waiting on a prior authorization.  She also has not contacted the [facility] yet.  Due for pap. Sexually active. Would like STI testing. Not on contraception.  Not desire pregnancy at this time, maybe in the future but does not want to start on contraception because of how it has made her feel in the past.  PERTINENT  PMH / WUJ:[id number]   OBJECTIVE:   BP 112/72   Pulse 71   Ht 5\' 4"  (1.626 m)   Wt 189 lb 3.2 oz (85.8 kg)   LMP 07/09/2022   SpO2 100%   BMI 32.48 kg/m    Physical exam General: well appearing, NAD Cardiovascular: RRR, no murmurs Lungs: CTAB. Normal WOB Abdomen: soft, non-distended, non-tender Skin: warm, dry. No edema Pelvic exam: normal external genitalia, vulva, vagina, cervix. Moderate amount of thin clear discharge   Chaperone: Deseree Blount   ASSESSMENT/PLAN:   Obesity (BMI 30.0-34.9) BMI 32.48. Will submit prior auth for wegovy as it was not initially approved by insurance. Continue healthy eating and exercise. Recommended also reaching out to [facility]. Will get insulin levels to assess for insulin resistance.   Cervical cancer screening Completed Pap smear. Pelvic exam with moderate amount of thin clear discharge. Sexually active and will check STIs, HIV, RPR. Declines contraception.    Cora Collum, DO [facility]

## 2022-07-20 NOTE — Patient Instructions (Addendum)
It was great seeing you today!  Today we did your pap and sti testing. I will call if anything is abnormal or will send a mychart if normal.   I will also check on doing a prior authorization for your medication  Feel free to call with any questions or concerns at any time, at [phone].   Take care,  Dr. Cora Collum [facility]

## 2022-07-22 ENCOUNTER — Other Ambulatory Visit: Payer: Self-pay | Admitting: Family Medicine

## 2022-07-22 DIAGNOSIS — E88819 Insulin resistance, unspecified: Secondary | ICD-10-CM

## 2022-07-22 LAB — RPR W/REFLEX TO TREPSURE

## 2022-07-22 MED ORDER — TIRZEPATIDE 2.5 MG/0.5ML ~~LOC~~ SOAJ: 2.5000 mg | SUBCUTANEOUS | 0 refills | Status: DC

## 2022-07-23 LAB — CYTOLOGY - PAP
Chlamydia: NEGATIVE
Comment: NEGATIVE
Comment: NEGATIVE
Comment: NEGATIVE
Comment: NORMAL
Diagnosis: UNDETERMINED — AB
High risk HPV: NEGATIVE
Neisseria Gonorrhea: NEGATIVE
Trichomonas: NEGATIVE

## 2022-07-23 LAB — T PALLIDUM ANTIBODY, EIA: T pallidum Antibody, EIA: NEGATIVE

## 2022-07-23 LAB — INSULIN, RANDOM: INSULIN: 9.8 u[IU]/mL (ref 2.6–24.9)

## 2022-07-23 LAB — HIV ANTIBODY (ROUTINE TESTING W REFLEX): HIV Screen 4th Generation wRfx: NONREACTIVE

## 2022-07-23 LAB — RPR W/REFLEX TO TREPSURE: RPR: NONREACTIVE

## 2022-07-25 ENCOUNTER — Other Ambulatory Visit: Payer: Self-pay | Admitting: Family Medicine

## 2022-07-25 DIAGNOSIS — E88819 Insulin resistance, unspecified: Secondary | ICD-10-CM

## 2022-07-27 ENCOUNTER — Ambulatory Visit (HOSPITAL_BASED_OUTPATIENT_CLINIC_OR_DEPARTMENT_OTHER): Payer: Medicaid Other | Admitting: Advanced Practice Midwife

## 2022-07-27 IMAGING — US US MFM OB DETAIL+14 WK
1 series · 13 of 28 positions shown · non-contrast
Comparison: none

[Series 1: us mfm ob detail+14 wk · 13 of 103 slices shown]
[im 4/103]
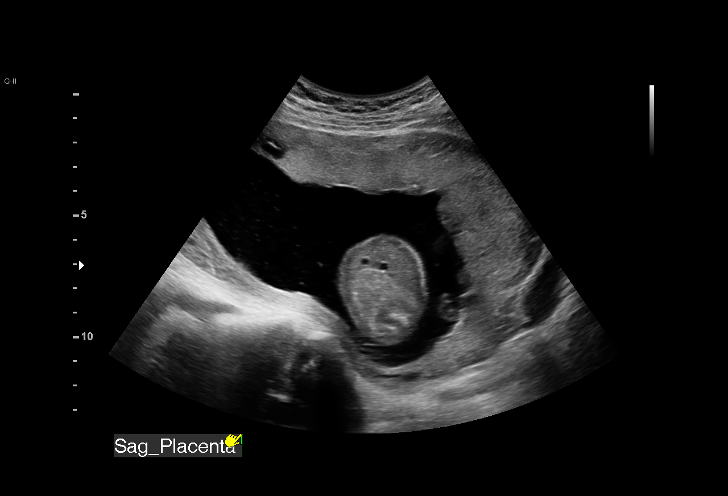
[im 12/103]
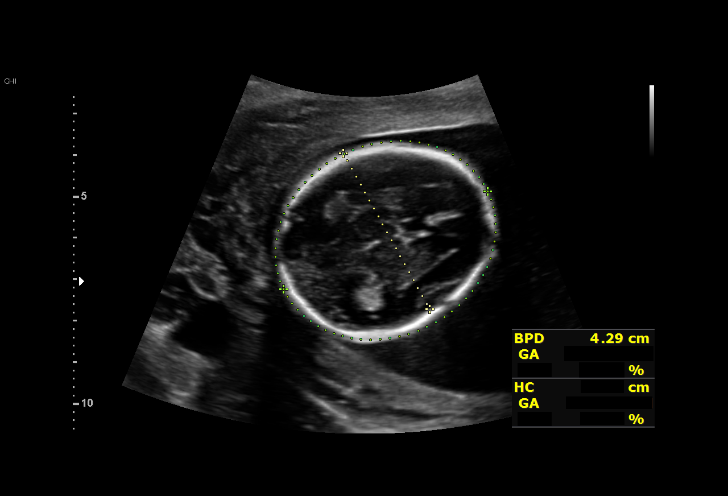
[im 19/103]
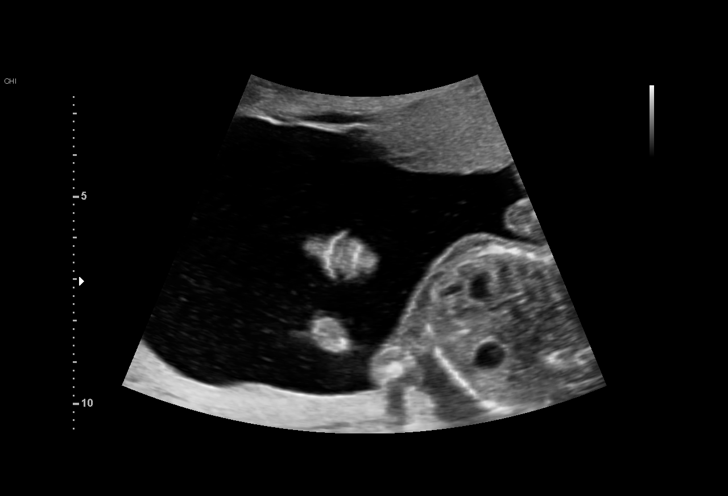
[im 27/103]
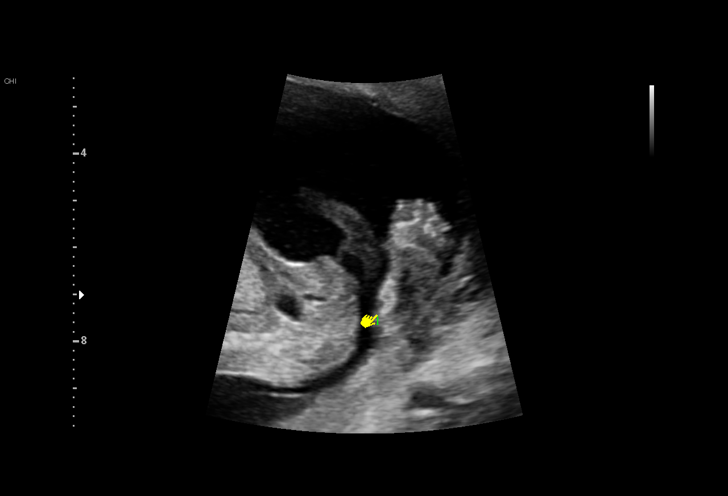
[im 35/103]
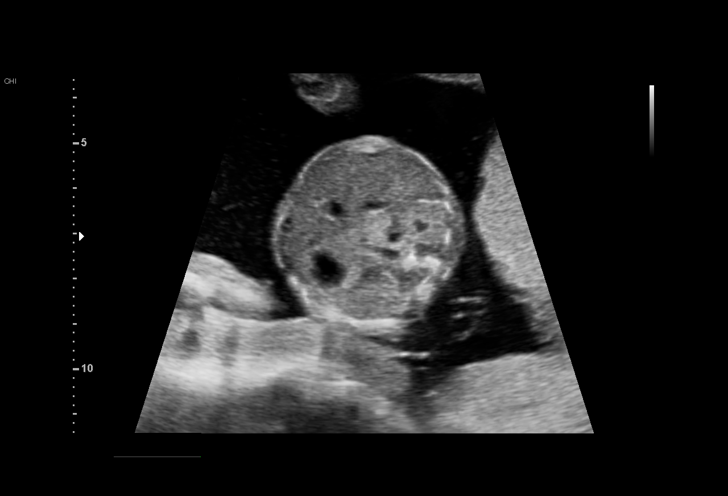
[im 42/103]
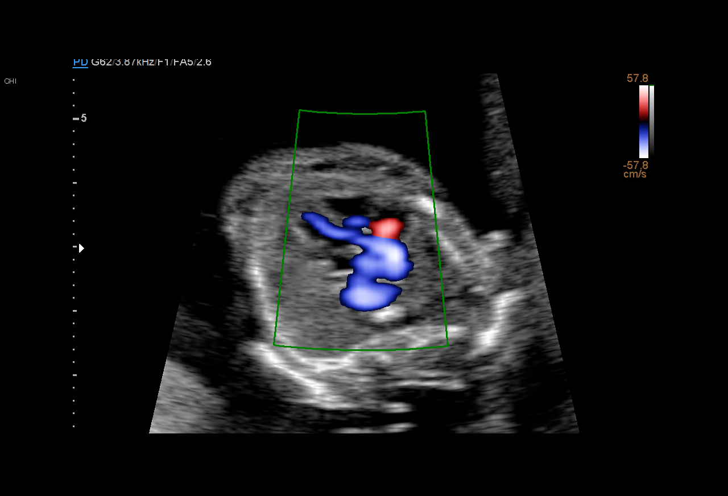
[im 53/103]
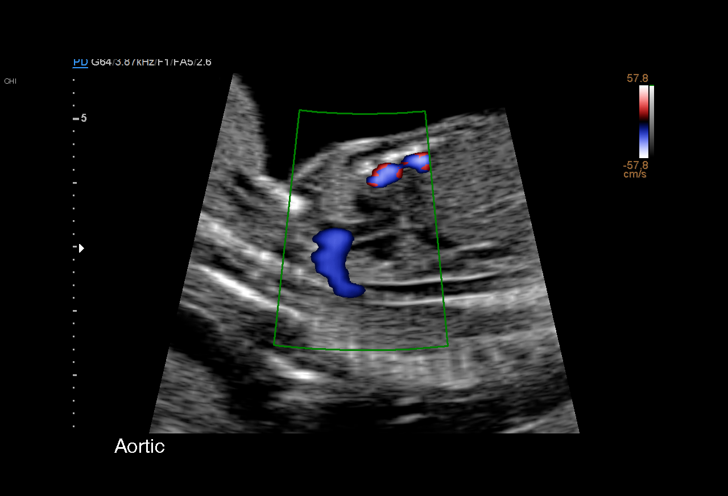
[im 61/103]
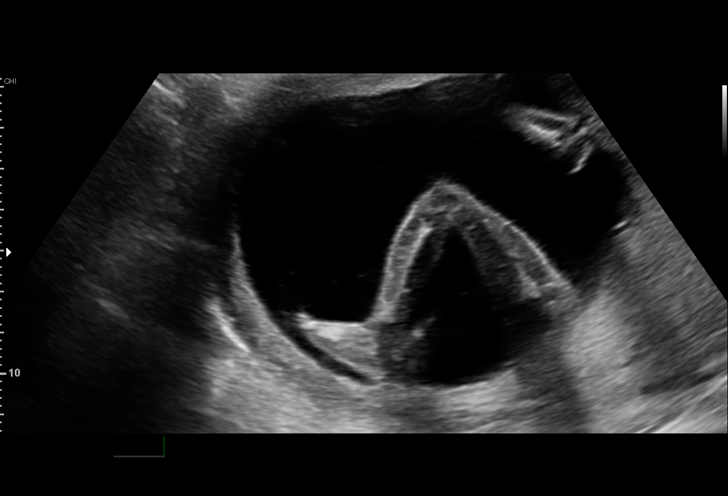
[im 69/103]
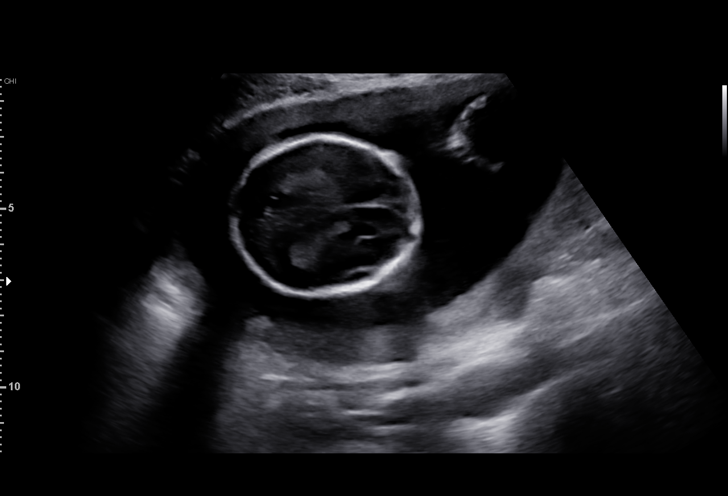
[im 76/103]
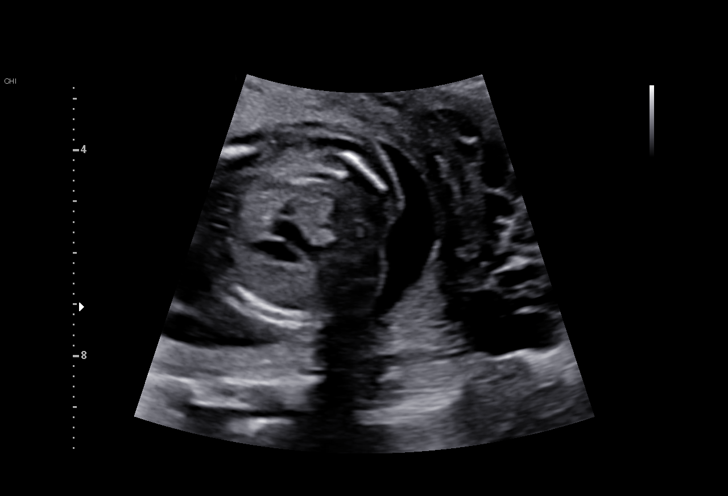
[im 84/103]
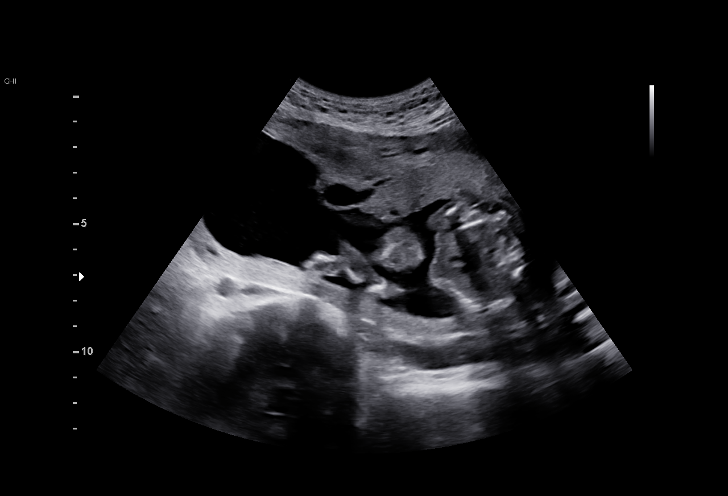
[im 91/103]
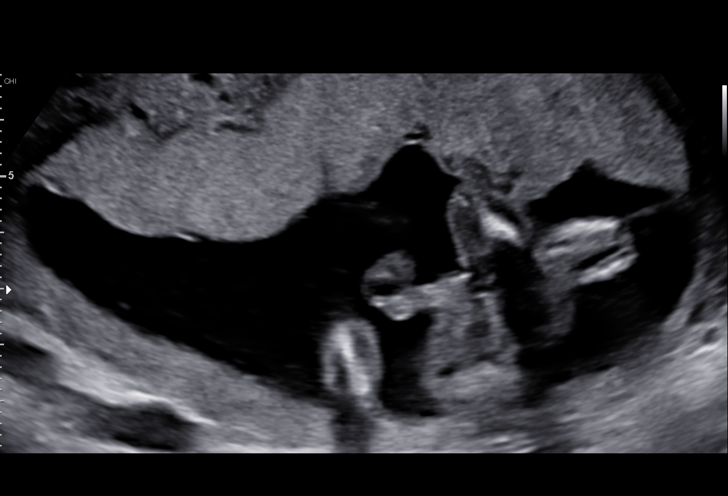
[im 99/103]
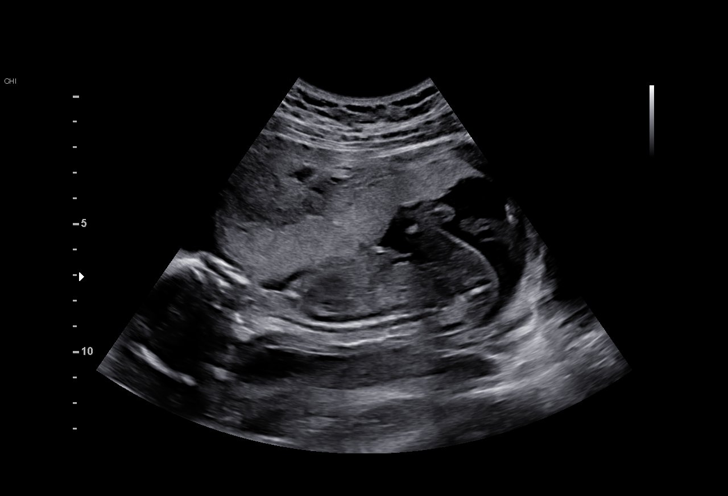

[13 of 28 positions shown; findings below may reference images not displayed]

Indications

 Maternal arrhythmia affecting pregnancy,       O99.412,
 second trimester (murmur)
 19 weeks gestation of pregnancy
 Encounter for antenatal screening for
 malformations
 Asthma                                         LEE.0E j78.202
 Low lying placenta, antepartum
Fetal Evaluation

 Num Of Fetuses:         1
 Fetal Heart Rate(bpm):  153
 Cardiac Activity:       Observed
 Presentation:           Breech
 Placenta:               Anterior low-lying, 1.2 cm from int os
 P. Cord Insertion:      Not well visualized

 Amniotic Fluid
 AFI FV:      Within normal limits

                             Largest Pocket(cm)

Biometry

 BPD:      43.4  mm     G. Age:  19w 1d         56  %    CI:        74.09   %    70 - 86
                                                         FL/HC:      19.1   %    16.1 -
 HC:      160.1  mm     G. Age:  18w 6d         34  %    HC/AC:      1.22        1.09 -
 AC:      131.5  mm     G. Age:  18w 5d         34  %    FL/BPD:     70.3   %
 FL:       30.5  mm     G. Age:  19w 3d         60  %    FL/AC:      23.2   %    20 - 24
 HUM:      29.4  mm     G. Age:  19w 4d         66  %
 CER:      20.2  mm     G. Age:  19w 3d         57  %
 NFT:       1.9  mm
 LV:        5.8  mm
 CM:        3.4  mm

 Est. FW:     269  gm      0 lb 9 oz     46  %
OB History

 Gravidity:    2         Term:   1
 Living:       1
Gestational Age

 LMP:           19w 0d        Date:  06/23/20                 EDD:   03/30/21
 U/S Today:     19w 0d                                        EDD:   03/30/21
 Best:          19w 0d     Det. By:  LMP  (06/23/20)          EDD:   03/30/21
Anatomy

 Cranium:               Appears normal         Aortic Arch:            Appears normal
 Cavum:                 Appears normal         Ductal Arch:            Appears normal
 Ventricles:            Appears normal         Diaphragm:              Appears normal
 Choroid Plexus:        Appears normal         Stomach:                Appears normal, left
                                                                       sided
 Cerebellum:            Appears normal         Abdomen:                Appears normal
 Posterior Fossa:       Appears normal         Abdominal Wall:         Appears nml (cord
                                                                       insert, abd wall)
 Nuchal Fold:           Appears normal         Cord Vessels:           Appears normal (3
                                                                       vessel cord)
 Face:                  Appears normal         Kidneys:                Appear normal
                        (orbits and profile)
 Lips:                  Appears normal         Bladder:                Appears normal
 Thoracic:              Appears normal         Spine:                  Not well visualized
 Heart:                 Appears normal         Upper Extremities:      Appears normal
                        (4CH, axis, and
                        situs)
 RVOT:                  Appears normal         Lower Extremities:      Appears normal
 LVOT:                  Appears normal

 Other:  Heels and 5th digit visualized. VC, 3VV and 3VTV visualized. Fetus
         appears to be a male.
Cervix Uterus Adnexa

 Cervix
 Length:           4.41  cm.
 Normal appearance by transabdominal scan.

 Right Ovary
 Not visualized.

 Left Ovary
 Not visualized.
Comments

  This patient was seen for a detailed fetal anatomy scan.  The
 patient reports that a heart murmur was auscultated on her
 recent physical exam.  She denies any history of a congenital
 heart defect or cardiac disease.  Due to the heart murmur,
 she is scheduled to see a cardiologist tomorrow.
 She denies any significant past medical history and denies
 any problems in her current pregnancy.
 She had a cell free DNA test earlier in her pregnancy which
 indicated a low risk for trisomy 21, 18, and 13. A male fetus is
 predicted.
 She was informed that the fetal growth and amniotic fluid
 level were appropriate for her gestational age.
 There were no obvious fetal anomalies noted on today's
 ultrasound exam.
 The patient was informed that anomalies may be missed due
 to technical limitations. If the fetus is in a suboptimal position
 or maternal habitus is increased, visualization of the fetus in
 the maternal uterus may be impaired.
 A low-lying placenta measuring 1.2 cm away from the internal
 cervical os was noted on today's ultrasound exam.  The
 patient was advised that the placenta previa will most likely
 resolve later in her pregnancy.
 A follow-up exam was scheduled in 6 weeks to assess the
 placental location and to follow the fetal growth.

## 2022-07-29 ENCOUNTER — Telehealth: Payer: Self-pay

## 2022-07-29 ENCOUNTER — Other Ambulatory Visit: Payer: Self-pay | Admitting: Family Medicine

## 2022-07-29 MED ORDER — METRONIDAZOLE 500 MG PO TABS: 500.0000 mg | ORAL_TABLET | Freq: Two times a day (BID) | ORAL | 0 refills | Status: AC

## 2022-07-29 MED ORDER — FLUCONAZOLE 150 MG PO TABS: 150.0000 mg | ORAL_TABLET | Freq: Once | ORAL | 0 refills | Status: AC

## 2022-07-29 NOTE — Telephone Encounter (Signed)
Rec'd PA request for patients Mounjaro.  Medication is not preferred on [employer]. Injections for weight loss are not covered.

## 2022-09-13 ENCOUNTER — Other Ambulatory Visit: Payer: Self-pay | Admitting: Family Medicine

## 2022-09-13 DIAGNOSIS — E88819 Insulin resistance, unspecified: Secondary | ICD-10-CM

## 2022-09-14 MED ORDER — TIRZEPATIDE 2.5 MG/0.5ML ~~LOC~~ SOAJ: 2.5000 mg | SUBCUTANEOUS | 0 refills | Status: AC

## 2022-09-17 ENCOUNTER — Other Ambulatory Visit (HOSPITAL_COMMUNITY): Payer: Self-pay

## 2022-10-12 ENCOUNTER — Other Ambulatory Visit (HOSPITAL_COMMUNITY)
Admission: RE | Admit: 2022-10-12 | Discharge: 2022-10-12 | Disposition: A | Discharge status: Home / Self Care | Payer: Medicaid Other | Source: Ambulatory Visit | Attending: Family Medicine | Admitting: Family Medicine

## 2022-10-12 ENCOUNTER — Ambulatory Visit: Payer: Medicaid Other | Admitting: Student

## 2022-10-12 VITALS — BP 137/77 | HR 80 | Resp 16 | Wt 177.8 lb

## 2022-10-12 DIAGNOSIS — N898 Other specified noninflammatory disorders of vagina: Secondary | ICD-10-CM | POA: Diagnosis not present

## 2022-10-12 LAB — POCT WET PREP (WET MOUNT)
Clue Cells Wet Prep Whiff POC: POSITIVE
Trichomonas Wet Prep HPF POC: ABSENT

## 2022-10-12 MED ORDER — METRONIDAZOLE 500 MG PO TABS: 500.0000 mg | ORAL_TABLET | Freq: Two times a day (BID) | ORAL | 0 refills | Status: AC

## 2022-10-12 NOTE — Patient Instructions (Addendum)
It was great to see you today! Thank you for choosing [facility] for your primary care.  Today we addressed: I will send in a prescription if needed based on your results.   If you haven't already, sign up for My Chart to have easy access to your labs results, and communication with your primary care physician. I recommend that you always bring your medications to each appointment as this makes it easy to ensure you are on the correct medications and helps [place] not miss refills when you need them. Call the clinic at [phone] if your symptoms worsen or you have any concerns. Return if symptoms worsen or fail to improve. Please arrive 15 minutes before your appointment to ensure smooth check in process.  We appreciate your efforts in making this happen.  Thank you for allowing me to participate in your care, Alfredo Martinez, MD 10/12/2022, 4:32 PM PGY-3, [facility]

## 2022-10-12 NOTE — Progress Notes (Signed)
  SUBJECTIVE:   CHIEF COMPLAINT / HPI:   Vaginal Discharge: -Present for one week -White discharge -Requests STI screening as well -Feels like something "is off" but no true irritation -No abdominal pain, fever, chills   PERTINENT  PMH / PSH: Anxiety, Asthma, MDD, seasonal allergies   Patient Care Team: Lockie Mola, MD as PCP - General (Family Medicine) Thomasene Ripple, DO as PCP - Cardiology (Cardiology) OBJECTIVE:  BP 137/77   Pulse 80   Resp 16   Wt 177 lb 12.8 oz (80.6 kg)   SpO2 100%   BMI 30.52 kg/m  Physical Exam  General: NAD, pleasant, able to participate in exam Respiratory: No respiratory distress Skin: warm and dry, no rashes noted Psych: Normal affect and mood  GU: CMA chaperone present Viscous white discharge in vaginal vault, otherwise normal ASSESSMENT/PLAN:  Vaginal discharge Assessment & Plan: Wet prep, bacterial vaginosis. Will treat with metronidazole. STI testing pending, no blood testing today. Follow up as needed  Orders: -     POCT Wet Prep [facility]) -     Cervicovaginal ancillary only -     metroNIDAZOLE; Take 1 tablet (500 mg total) by mouth 2 (two) times daily. For 7 days. Do not drink alcohol while taking this medication.  Dispense: 14 tablet; Refill: 0   Return if symptoms worsen or fail to improve. Alfredo Martinez, MD 10/12/2022, 4:38 PM PGY-3, [facility]

## 2022-10-12 NOTE — Assessment & Plan Note (Signed)
Wet prep, bacterial vaginosis. Will treat with metronidazole. STI testing pending, no blood testing today. Follow up as needed
